# Patient Record
Sex: Male | Born: 1985
Health system: Southern US, Community
[De-identification: ages and names within clinical notes are randomized; demographics above are authoritative.]

## PROBLEM LIST (undated history)

## (undated) DIAGNOSIS — F329 Major depressive disorder, single episode, unspecified: Secondary | ICD-10-CM

## (undated) DIAGNOSIS — F32A Depression, unspecified: Secondary | ICD-10-CM

## (undated) DIAGNOSIS — Z87891 Personal history of nicotine dependence: Secondary | ICD-10-CM

## (undated) DIAGNOSIS — E119 Type 2 diabetes mellitus without complications: Secondary | ICD-10-CM

## (undated) DIAGNOSIS — K219 Gastro-esophageal reflux disease without esophagitis: Secondary | ICD-10-CM

## (undated) DIAGNOSIS — E669 Obesity, unspecified: Secondary | ICD-10-CM

## (undated) DIAGNOSIS — G4730 Sleep apnea, unspecified: Secondary | ICD-10-CM

## (undated) DIAGNOSIS — I10 Essential (primary) hypertension: Secondary | ICD-10-CM

## (undated) HISTORY — DX: Personal history of nicotine dependence: Z87.891

## (undated) HISTORY — PX: TONSILLECTOMY: SUR1361

## (undated) HISTORY — DX: Sleep apnea, unspecified: G47.30

## (undated) HISTORY — DX: Major depressive disorder, single episode, unspecified: F32.9

## (undated) HISTORY — DX: Essential (primary) hypertension: I10

## (undated) HISTORY — DX: Type 2 diabetes mellitus without complications: E11.9

## (undated) HISTORY — DX: Gastro-esophageal reflux disease without esophagitis: K21.9

## (undated) HISTORY — DX: Depression, unspecified: F32.A

## (undated) HISTORY — DX: Obesity, unspecified: E66.9

## (undated) HISTORY — PX: TONSILLECTOMY: SHX5217

---
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
## 2007-02-10 ENCOUNTER — Emergency Department: Payer: Self-pay | Admitting: Emergency Medicine

## 2011-02-06 ENCOUNTER — Emergency Department: Payer: Self-pay | Admitting: Unknown Physician Specialty

## 2012-01-22 ENCOUNTER — Emergency Department: Payer: Self-pay | Admitting: Emergency Medicine

## 2014-12-16 ENCOUNTER — Encounter: Payer: Self-pay | Admitting: Family Medicine

## 2014-12-16 ENCOUNTER — Ambulatory Visit (INDEPENDENT_AMBULATORY_CARE_PROVIDER_SITE_OTHER): Payer: Managed Care, Other (non HMO) | Admitting: Family Medicine

## 2014-12-16 VITALS — BP 145/91 | HR 96 | Temp 97.9°F | Ht 73.0 in

## 2014-12-16 DIAGNOSIS — H579 Unspecified disorder of eye and adnexa: Secondary | ICD-10-CM

## 2014-12-16 DIAGNOSIS — K219 Gastro-esophageal reflux disease without esophagitis: Secondary | ICD-10-CM

## 2014-12-16 DIAGNOSIS — I10 Essential (primary) hypertension: Secondary | ICD-10-CM | POA: Diagnosis not present

## 2014-12-16 DIAGNOSIS — E119 Type 2 diabetes mellitus without complications: Secondary | ICD-10-CM | POA: Diagnosis not present

## 2014-12-16 DIAGNOSIS — G4730 Sleep apnea, unspecified: Secondary | ICD-10-CM

## 2014-12-16 MED ORDER — LISINOPRIL-HYDROCHLOROTHIAZIDE 10-12.5 MG PO TABS: 1.0000 | ORAL_TABLET | Freq: Every day | ORAL | Status: DC

## 2014-12-16 NOTE — Patient Instructions (Addendum)
We'll check labs and refer you to the diabetic educator and eye doctor Start the new medicine for your blood pressure Return in 2-3 weeks for recheck and we'll get one lab then to recheck your kidneys Try to work on weight loss and healthy eating Check your feet every night, looking for infection or red streaks or sores; notify doctor right away of any problems  DASH Eating Plan DASH stands for "Dietary Approaches to Stop Hypertension." The DASH eating plan is a healthy eating plan that has been shown to reduce high blood pressure (hypertension). Additional health benefits may include reducing the risk of type 2 diabetes mellitus, heart disease, and stroke. The DASH eating plan may also help with weight loss. WHAT DO I NEED TO KNOW ABOUT THE DASH EATING PLAN? For the DASH eating plan, you will follow these general guidelines:  Choose foods with a percent daily value for sodium of less than 5% (as listed on the food label).  Use salt-free seasonings or herbs instead of table salt or sea salt.  Check with your health care provider or pharmacist before using salt substitutes.  Eat lower-sodium products, often labeled as "lower sodium" or "no salt added."  Eat fresh foods.  Eat more vegetables, fruits, and low-fat dairy products.  Choose whole grains. Look for the word "whole" as the first word in the ingredient list.  Choose fish and skinless chicken or [place] more often than red meat. Limit fish, poultry, and meat to 6 oz (170 g) each day.  Limit sweets, desserts, sugars, and sugary drinks.  Choose heart-healthy fats.  Limit cheese to 1 oz (28 g) per day.  Eat more home-cooked food and less restaurant, buffet, and fast food.  Limit fried foods.  Cook foods using methods other than frying.  Limit canned vegetables. If you do use them, rinse them well to decrease the sodium.  When eating at a restaurant, ask that your food be prepared with less salt, or no salt if possible. WHAT  FOODS CAN I EAT? Seek help from a dietitian for individual calorie needs. Grains Whole grain or whole wheat bread. Brown rice. Whole grain or whole wheat pasta. Quinoa, bulgur, and whole grain cereals. Low-sodium cereals. Corn or whole wheat flour tortillas. Whole grain cornbread. Whole grain crackers. Low-sodium crackers. Vegetables Fresh or frozen vegetables (raw, steamed, roasted, or grilled). Low-sodium or reduced-sodium tomato and vegetable juices. Low-sodium or reduced-sodium tomato sauce and paste. Low-sodium or reduced-sodium canned vegetables.  Fruits All fresh, canned (in natural juice), or frozen fruits. Meat and Other Protein Products Ground beef (85% or leaner), grass-fed beef, or beef trimmed of fat. Skinless chicken or [place]. Ground chicken or [place]. Pork trimmed of fat. All fish and seafood. Eggs. Dried beans, peas, or lentils. Unsalted nuts and seeds. Unsalted canned beans. Dairy Low-fat dairy products, such as skim or 1% milk, 2% or reduced-fat cheeses, low-fat ricotta or cottage cheese, or plain low-fat yogurt. Low-sodium or reduced-sodium cheeses. Fats and Oils Tub margarines without trans fats. Light or reduced-fat mayonnaise and salad dressings (reduced sodium). Avocado. Safflower, olive, or canola oils. Natural peanut or almond butter. Other Unsalted popcorn and pretzels. The items listed above may not be a complete list of recommended foods or beverages. Contact your dietitian for more options. WHAT FOODS ARE NOT RECOMMENDED? Grains White bread. White pasta. White rice. Refined cornbread. Bagels and croissants. Crackers that contain trans fat. Vegetables Creamed or fried vegetables. Vegetables in a cheese sauce. Regular canned vegetables. Regular canned tomato sauce  and paste. Regular tomato and vegetable juices. Fruits Dried fruits. Canned fruit in light or heavy syrup. Fruit juice. Meat and Other Protein Products Fatty cuts of meat. Ribs, chicken wings, bacon,  sausage, bologna, salami, chitterlings, fatback, hot dogs, bratwurst, and packaged luncheon meats. Salted nuts and seeds. Canned beans with salt. Dairy Whole or 2% milk, cream, half-and-half, and cream cheese. Whole-fat or sweetened yogurt. Full-fat cheeses or blue cheese. Nondairy creamers and whipped toppings. Processed cheese, cheese spreads, or cheese curds. Condiments Onion and garlic salt, seasoned salt, table salt, and sea salt. Canned and packaged gravies. Worcestershire sauce. Tartar sauce. Barbecue sauce. Teriyaki sauce. Soy sauce, including reduced sodium. Steak sauce. Fish sauce. Oyster sauce. Cocktail sauce. Horseradish. Ketchup and mustard. Meat flavorings and tenderizers. Bouillon cubes. Hot sauce. Tabasco sauce. Marinades. Taco seasonings. Relishes. Fats and Oils Butter, stick margarine, lard, shortening, ghee, and bacon fat. Coconut, palm kernel, or palm oils. Regular salad dressings. Other Pickles and olives. Salted popcorn and pretzels. The items listed above may not be a complete list of foods and beverages to avoid. Contact your dietitian for more information. WHERE CAN I FIND MORE INFORMATION? National Heart, Lung, and Blood Institute: [employer] Document Released: 02/23/2011 Document Revised: 07/21/2013 Document Reviewed: 01/08/2013 [facility] Patient Information 2015 [place], [place]. This information is not intended to replace advice given to you by your health care provider. Make sure you discuss any questions you have with your health care provider. Diabetes Mellitus and Food It is important for you to manage your blood sugar (glucose) level. Your blood glucose level can be greatly affected by what you eat. Eating healthier foods in the appropriate amounts throughout the day at about the same time each day will help you control your blood glucose level. It can also help slow or prevent worsening of your diabetes mellitus. Healthy eating  may even help you improve the level of your blood pressure and reach or maintain a healthy weight.  HOW CAN FOOD AFFECT ME? Carbohydrates Carbohydrates affect your blood glucose level more than any other type of food. Your dietitian will help you determine how many carbohydrates to eat at each meal and teach you how to count carbohydrates. Counting carbohydrates is important to keep your blood glucose at a healthy level, especially if you are using insulin or taking certain medicines for diabetes mellitus. Alcohol Alcohol can cause sudden decreases in blood glucose (hypoglycemia), especially if you use insulin or take certain medicines for diabetes mellitus. Hypoglycemia can be a life-threatening condition. Symptoms of hypoglycemia (sleepiness, dizziness, and disorientation) are similar to symptoms of having too much alcohol.  If your health care provider has given you approval to drink alcohol, do so in moderation and use the following guidelines:  Women should not have more than one drink per day, and men should not have more than two drinks per day. One drink is equal to:  12 oz of beer.  5 oz of wine.  1 oz of hard liquor.  Do not drink on an empty stomach.  Keep yourself hydrated. Have water, diet soda, or unsweetened iced tea.  Regular soda, juice, and other mixers might contain a lot of carbohydrates and should be counted. WHAT FOODS ARE NOT RECOMMENDED? As you make food choices, it is important to remember that all foods are not the same. Some foods have fewer nutrients per serving than other foods, even though they might have the same number of calories or carbohydrates. It is difficult to get your body what it needs  when you eat foods with fewer nutrients. Examples of foods that you should avoid that are high in calories and carbohydrates but low in nutrients include:  Trans fats (most processed foods list trans fats on the Nutrition Facts label).  Regular  soda.  Juice.  Candy.  Sweets, such as cake, pie, doughnuts, and cookies.  Fried foods. WHAT FOODS CAN I EAT? Have nutrient-rich foods, which will nourish your body and keep you healthy. The food you should eat also will depend on several factors, including:  The calories you need.  The medicines you take.  Your weight.  Your blood glucose level.  Your blood pressure level.  Your cholesterol level. You also should eat a variety of foods, including:  Protein, such as meat, poultry, fish, tofu, nuts, and seeds (lean animal proteins are best).  Fruits.  Vegetables.  Dairy products, such as milk, cheese, and yogurt (low fat is best).  Breads, grains, pasta, cereal, rice, and beans.  Fats such as olive oil, trans fat-free margarine, canola oil, avocado, and olives. DOES EVERYONE WITH DIABETES MELLITUS HAVE THE SAME MEAL PLAN? Because every person with diabetes mellitus is different, there is not one meal plan that works for everyone. It is very important that you meet with a dietitian who will help you create a meal plan that is just right for you. Document Released: 12/01/2004 Document Revised: 03/11/2013 Document Reviewed: 01/31/2013 [facility] Patient Information 2015 [place], [place]. This information is not intended to replace advice given to you by your health care provider. Make sure you discuss any questions you have with your health care provider.

## 2014-12-16 NOTE — Assessment & Plan Note (Signed)
Using APAP, under the care of another specialist

## 2014-12-16 NOTE — Assessment & Plan Note (Addendum)
Greater than 450 pounds visually, patient says he weighs 538 pounds but I just cannot verify that on our scales; will consider using injectable like Victoza to help with diabetes but also weight loss; he denies any hx of MEN, medullary thyroid carcinoma; check TSH today as well

## 2014-12-16 NOTE — Assessment & Plan Note (Signed)
Refer to eye doctor to evaluate the change seen after injury, but more for retinal evaluation, as it sounds like patient has not had diabetic eye exam for a few years

## 2014-12-16 NOTE — Progress Notes (Signed)
BP 145/91 mmHg  Pulse 96  Temp(Src) 97.9 F (36.6 C)  Ht  (1.854 m)  Wt   SpO2 98%   Subjective:    Patient ID: Walter Parks, male    DOB: Jan 25, 1986, 29 y.o.   MRN: [id number]  HPI: Walter Parks is a 29 y.o. male  Chief Complaint  Patient presents with  . Establish Care  Patient is brand new to the practice; has not had labs or medicines or medical care for quite some time; did not really elaborate, but is here at the encouragement of loved one  He has type 2 diabetes; diagnosed in 2014; used to take metformin, no problems taking that medicine, but has run out a while ago and is ready to get back on; diabetes runs in the family, aunts and cousins mostly and they are overweight; parents do not have diabetes  He has morbid obesity; this is his peak weight; he has always been heavy though; his weight has really increase though over the last several years; he started working third shift about 7 years ago at the [employer]; he has lost weight in the past using a specific diet plan, took Xenical; however, he had significant stool leakage and stopped taking it; worked well but the side effects were prohibitive; he eats the typical [employer], loves meat; not picky; no eggs or cheese; lactose intolerant  High blood pressure; chronic problem that has worsened since being off of his medicine; he used to take HCTZ; did pee too much and had dry mouth; uses some salt; not every meal and not every day  He has acid reflux; tomoatoes and oranges are triggers for his symptoms; he is allergic to lemons  Using APAP for sleep apnea; sees another doctor for that  Does feel frustration with health; but not SI, not really depression; that was listed from family input apparently  Flu shot declined by patient today (recommended, but he is not interested)  Past Medical History  Diagnosis Date  . Obesity   . Diabetes mellitus without complication   . Hypertension   . Sleep apnea     on an APAP  machine  . GERD (gastroesophageal reflux disease)   . Depression    Relevant past medical, surgical, family and social history reviewed and updated as indicated. Interim medical history since our last visit reviewed. Allergies and medications reviewed and updated.  Review of Systems  Constitutional: Negative for unexpected weight change.  HENT: Negative for ear discharge.   Eyes:       New spot on his right eye that came up a few years ago after an injury  Skin:       Had toenails removed a month ago; there were rubbing against his steel-toed boots  Allergic/Immunologic: Positive for food allergies.  Neurological: Negative for numbness.  Psychiatric/Behavioral: Negative for sleep disturbance (doing well with APAP) and dysphoric mood.   Per HPI unless specifically indicated above     Objective:    BP 145/91 mmHg  Pulse 96  Temp(Src) 97.9 F (36.6 C)  Ht  (1.854 m)  Wt   SpO2 98%  Wt Readings from Last 3 Encounters:  No data found for Wt  Patient was too large for our scales; patient says he weighs about 538 pounds That would put his BMI at about 71  Physical Exam  Constitutional: He appears well-developed. No distress.  Morbidly obese male, weight visually greater than 450 pounds  HENT:  Head: Normocephalic and atraumatic.  Neck:  Thick, obese  Cardiovascular: Normal rate and regular rhythm.  Exam reveals distant heart sounds.   Pulmonary/Chest: Effort normal. No respiratory distress. He has decreased breath sounds (breath sounds distant, most likely secondary to body habitus, morbid obesity).  Abdominal: Soft. Bowel sounds are normal.  Morbidly obese, large pannus  Musculoskeletal: He exhibits no edema.  Neurological: He displays no tremor.  Skin: Skin is warm and dry.  Psychiatric: He has a normal mood and affect. His speech is normal and behavior is normal. Judgment and thought content normal. Cognition and memory are normal.   Diabetic Foot Form - Detailed    Diabetic Foot Exam - detailed  Diabetic Foot exam was performed with the following findings:  Yes 12/16/2014  9:33 AM  Are the shoes appropriate in style and fit?:  Yes  Is there swelling or and abnormal foot shape?:  No  Are the toenails long?:  No  Are the toenails thick?:  No  Is there a claw toe deformity?:  No  Is there elevated skin temparature?:  No  Is there limited skin dorsiflexion?:  No  Are the toenails ingrown?:  No (Comment: status post matrixectomy both great toenails; fibrinous material bases of both nailbeds; no prox erythema)  Normal Range of Motion:  Yes    Pulse Foot Exam completed.:  Yes  Right Dorsalis Pedis:  Diminished Left Dorsalis Pedis:  Diminished  Sensory Foot Exam Completed.:  Yes  Semmes-Weinstein Monofilament Test  R Site 1-Great Toe:  Pos L Site 1-Great Toe:  Pos  R Site 4:  Pos L Site 4:  Pos  R Site 5:  Pos L Site 5:  Pos    Comments:  Thickened skin on the plantar surfaces, worse on lateral aspects      No results found for this or any previous visit.    Assessment & Plan:   Problem List Items Addressed This Visit      Cardiovascular and Mediastinum   Essential hypertension, benign    New problem to me (he is establishing care); uncontrolled today; start medicine; discussed starting lower dose of the HCTZ that he used to use, and adding a medicine to help protect his kidneys since he has diabetes; check creatinine and K+ today and recheck those again in 2-3 weeks; DASH guidelines encouragad      Relevant Medications   lisinopril-hydrochlorothiazide (PRINZIDE,ZESTORETIC) 10-12.5 MG tablet     Digestive   Acid reflux    Avoid triggers; likely exacerbated by his morbid obesity        Endocrine   Type 2 diabetes mellitus - Primary    Check A1C, urine microalbumin, check creatinine and then start metformin; consider starting Victoza or similar injectable; he denies any hx of MEN, med thyroid ca; refer to diabetic educator      Relevant  Medications   lisinopril-hydrochlorothiazide (PRINZIDE,ZESTORETIC) 10-12.5 MG tablet   Other Relevant Orders   Hgb A1c w/o eAG   Lipid Panel w/o Chol/HDL Ratio   Microalbumin / creatinine urine ratio   Comprehensive metabolic panel   Ambulatory referral to Ophthalmology   Ambulatory referral to diabetic education     Other   Eye lesion    Refer to eye doctor to evaluate the change seen after injury, but more for retinal evaluation, as it sounds like patient has not had diabetic eye exam for a few years      Relevant Orders   Ambulatory referral to Ophthalmology  Morbid obesity    Greater than 450 pounds visually, patient says he weighs 538 pounds but I just cannot verify that on our scales; will consider using injectable like Victoza to help with diabetes but also weight loss; he denies any hx of MEN, medullary thyroid carcinoma; check TSH today as well      Relevant Orders   TSH   Sleep apnea    Using APAP, under the care of another specialist         Follow up plan: Return 2-3 weeks, for blood pressure, diabetes.  An after-visit summary was printed and given to the patient at check-out.  Please see the patient instructions which may contain other information and recommendations beyond what is mentioned above in the assessment and plan.  Meds ordered this encounter  Medications  . lisinopril-hydrochlorothiazide (PRINZIDE,ZESTORETIC) 10-12.5 MG tablet    Sig: Take 1 tablet by mouth daily.    Dispense:  30 tablet    Refill:  0   Orders Placed This Encounter  Procedures  . Hgb A1c w/o eAG  . Lipid Panel w/o Chol/HDL Ratio  . TSH  . Microalbumin / creatinine urine ratio  . Comprehensive metabolic panel  . Ambulatory referral to Ophthalmology  . Ambulatory referral to diabetic education

## 2014-12-16 NOTE — Assessment & Plan Note (Signed)
New problem to me (he is establishing care); uncontrolled today; start medicine; discussed starting lower dose of the HCTZ that he used to use, and adding a medicine to help protect his kidneys since he has diabetes; check creatinine and K+ today and recheck those again in 2-3 weeks; DASH guidelines encouragad

## 2014-12-16 NOTE — Assessment & Plan Note (Signed)
Avoid triggers; likely exacerbated by his morbid obesity

## 2014-12-16 NOTE — Assessment & Plan Note (Signed)
Check A1C, urine microalbumin, check creatinine and then start metformin; consider starting Victoza or similar injectable; he denies any hx of MEN, med thyroid ca; refer to diabetic educator

## 2014-12-17 ENCOUNTER — Telehealth: Payer: Self-pay | Admitting: Family Medicine

## 2014-12-17 LAB — LIPID PANEL W/O CHOL/HDL RATIO
CHOLESTEROL TOTAL: 157 mg/dL (ref 100–199)
HDL: 31 mg/dL — AB (ref >39)
LDL Calculated: 109 mg/dL — ABNORMAL HIGH (ref 0–99)
TRIGLYCERIDES: 86 mg/dL (ref 0–149)
VLDL Cholesterol Cal: 17 mg/dL (ref 5–40)

## 2014-12-17 LAB — COMPREHENSIVE METABOLIC PANEL
ALBUMIN: 3.7 g/dL (ref 3.5–5.5)
ALT: 16 IU/L (ref 0–44)
AST: 19 IU/L (ref 0–40)
Albumin/Globulin Ratio: 1.1 (ref 1.1–2.5)
Alkaline Phosphatase: 109 IU/L (ref 39–117)
BUN / CREAT RATIO: 10 (ref 8–19)
BUN: 10 mg/dL (ref 6–20)
Bilirubin Total: 0.3 mg/dL (ref 0.0–1.2)
CALCIUM: 9.4 mg/dL (ref 8.7–10.2)
CHLORIDE: 99 mmol/L (ref 97–108)
CO2: 24 mmol/L (ref 18–29)
CREATININE: 1 mg/dL (ref 0.76–1.27)
GFR, EST AFRICAN AMERICAN: 118 mL/min/{1.73_m2} (ref >59)
GFR, EST NON AFRICAN AMERICAN: 102 mL/min/{1.73_m2} (ref >59)
GLUCOSE: 128 mg/dL — AB (ref 65–99)
Globulin, Total: 3.5 g/dL (ref 1.5–4.5)
Potassium: 4.3 mmol/L (ref 3.5–5.2)
Sodium: 140 mmol/L (ref 134–144)
TOTAL PROTEIN: 7.2 g/dL (ref 6.0–8.5)

## 2014-12-17 LAB — MICROALBUMIN / CREATININE URINE RATIO
Creatinine, Urine: 210.9 mg/dL
MICROALB/CREAT RATIO: 3.3 mg/g{creat} (ref 0.0–30.0)
Microalbumin, Urine: 7 ug/mL

## 2014-12-17 LAB — HGB A1C W/O EAG: Hgb A1c MFr Bld: 7.4 % — ABNORMAL HIGH (ref 4.8–5.6)

## 2014-12-17 LAB — TSH: TSH: 1.62 u[IU]/mL (ref 0.450–4.500)

## 2014-12-17 MED ORDER — METFORMIN HCL ER 500 MG PO TB24: ORAL_TABLET | ORAL | Status: DC

## 2014-12-17 NOTE — Telephone Encounter (Signed)
I called to go over labs with patient; left brief msg; will start him back on metformin, sending to pharmacy; will try him tomorrow to go over all labs

## 2014-12-21 NOTE — Telephone Encounter (Signed)
I talked with patient about labs He is urinating quite a bit; offered to stop the diuretic part; he'll give it a little more time; if palpitations or leg cramps, then do call before next appt in case he's urinating out too many electrolytes A1C not too bad for being off of medicine HDL too low, weight loss and more activity should help that Will continue current meds for now and see him back in a few weeks, but call before if needed; he agrees

## 2015-01-05 ENCOUNTER — Ambulatory Visit: Payer: Managed Care, Other (non HMO) | Admitting: Family Medicine

## 2015-02-04 ENCOUNTER — Other Ambulatory Visit: Payer: Self-pay | Admitting: Family Medicine

## 2015-02-04 NOTE — Telephone Encounter (Signed)
Routing to provider  

## 2015-02-05 ENCOUNTER — Telehealth: Payer: Self-pay | Admitting: Family Medicine

## 2015-02-05 MED ORDER — METFORMIN HCL ER 500 MG PO TB24: 1000.0000 mg | ORAL_TABLET | Freq: Every evening | ORAL | Status: DC

## 2015-02-05 NOTE — Telephone Encounter (Signed)
Patient no showed for his appointment He needs a visit and labs I'll refill this time

## 2015-02-05 NOTE — Telephone Encounter (Signed)
Called patient to call us back and reschedule his missed appointment with labs.

## 2015-02-10 ENCOUNTER — Encounter: Payer: Self-pay | Admitting: Family Medicine

## 2015-02-13 ENCOUNTER — Emergency Department
Admission: EM | Admit: 2015-02-13 | Discharge: 2015-02-13 | Disposition: A | Discharge status: Home / Self Care | Payer: Managed Care, Other (non HMO) | Attending: Emergency Medicine | Admitting: Emergency Medicine

## 2015-02-13 ENCOUNTER — Encounter: Payer: Self-pay | Admitting: Medical Oncology

## 2015-02-13 DIAGNOSIS — Z79899 Other long term (current) drug therapy: Secondary | ICD-10-CM | POA: Diagnosis not present

## 2015-02-13 DIAGNOSIS — M549 Dorsalgia, unspecified: Secondary | ICD-10-CM | POA: Diagnosis present

## 2015-02-13 DIAGNOSIS — I10 Essential (primary) hypertension: Secondary | ICD-10-CM | POA: Insufficient documentation

## 2015-02-13 DIAGNOSIS — Z7984 Long term (current) use of oral hypoglycemic drugs: Secondary | ICD-10-CM | POA: Insufficient documentation

## 2015-02-13 DIAGNOSIS — F17210 Nicotine dependence, cigarettes, uncomplicated: Secondary | ICD-10-CM | POA: Insufficient documentation

## 2015-02-13 DIAGNOSIS — M5416 Radiculopathy, lumbar region: Secondary | ICD-10-CM | POA: Diagnosis not present

## 2015-02-13 DIAGNOSIS — E119 Type 2 diabetes mellitus without complications: Secondary | ICD-10-CM | POA: Insufficient documentation

## 2015-02-13 DIAGNOSIS — M5410 Radiculopathy, site unspecified: Secondary | ICD-10-CM

## 2015-02-13 MED ORDER — METHOCARBAMOL 750 MG PO TABS: 1500.0000 mg | ORAL_TABLET | Freq: Four times a day (QID) | ORAL | Status: DC

## 2015-02-13 MED ORDER — ORPHENADRINE CITRATE 30 MG/ML IJ SOLN
60.0000 mg | Freq: Two times a day (BID) | INTRAMUSCULAR | Status: DC
  Administered 2015-02-13: 60 mg via INTRAMUSCULAR
  Filled 2015-02-13: qty 2

## 2015-02-13 MED ORDER — OXYCODONE-ACETAMINOPHEN 7.5-325 MG PO TABS: 1.0000 | ORAL_TABLET | Freq: Four times a day (QID) | ORAL | Status: DC | PRN

## 2015-02-13 MED ORDER — KETOROLAC TROMETHAMINE 60 MG/2ML IM SOLN
60.0000 mg | Freq: Once | INTRAMUSCULAR | Status: AC
  Administered 2015-02-13: 60 mg via INTRAMUSCULAR
  Filled 2015-02-13: qty 2

## 2015-02-13 MED ORDER — KETOROLAC TROMETHAMINE 10 MG PO TABS: 10.0000 mg | ORAL_TABLET | Freq: Four times a day (QID) | ORAL | Status: DC | PRN

## 2015-02-13 MED ORDER — HYDROMORPHONE HCL 1 MG/ML IJ SOLN
1.0000 mg | Freq: Once | INTRAMUSCULAR | Status: AC
  Administered 2015-02-13: 1 mg via INTRAMUSCULAR
  Filled 2015-02-13: qty 1

## 2015-02-13 NOTE — ED Notes (Signed)
Pt ambulatory to triage with reports of left lower back pain that radiates into left hip, began this am.

## 2015-02-13 NOTE — ED Provider Notes (Signed)
[facility] Emergency Department Provider Note  ____________________________________________  Time seen: Approximately 4:00 PM  I have reviewed the triage vital signs and the nursing notes.   HISTORY  Chief Complaint Back Pain    HPI Walter Parks is a 29 y.o. male patient complaining of acute onset of radicular left back pain. Patient states she awakened this morning with this complaint. This is the first episode for this patient.Patient denies any bladder or bowel dysfunction. No palliative measures taken for this complaint. Patient rated his pain as a 9/10 and described the pain as sharp.   Past Medical History  Diagnosis Date  . Obesity   . Diabetes mellitus without complication (HCC)   . Hypertension   . Sleep apnea     on an APAP machine  . GERD (gastroesophageal reflux disease)   . Depression     Patient Active Problem List   Diagnosis Date Noted  . Type 2 diabetes mellitus (HCC) 12/16/2014  . Eye lesion 12/16/2014  . Essential hypertension, benign 12/16/2014  . Morbid obesity (HCC) 12/16/2014  . Sleep apnea 12/16/2014  . Acid reflux 12/16/2014    Past Surgical History  Procedure Laterality Date  . Tonsillectomy      Current Outpatient Rx  Name  Route  Sig  Dispense  Refill  . ketorolac (TORADOL) 10 MG tablet   Oral   Take 1 tablet (10 mg total) by mouth every 6 (six) hours as needed.   20 tablet   0   . lisinopril-hydrochlorothiazide (PRINZIDE,ZESTORETIC) 10-12.5 MG tablet      TAKE 1 TABLET BY MOUTH DAILY.   30 tablet   0   . metFORMIN (GLUCOPHAGE-XR) 500 MG 24 hr tablet   Oral   Take 2 tablets (1,000 mg total) by mouth every evening. One by mouth every evening for one week, then two every evening   60 tablet   0     *Use THIS prescription please*   . methocarbamol (ROBAXIN-750) 750 MG tablet   Oral   Take 2 tablets (1,500 mg total) by mouth 4 (four) times daily.   40 tablet   0   . oxyCODONE-acetaminophen  (PERCOCET) 7.5-325 MG tablet   Oral   Take 1 tablet by mouth every 6 (six) hours as needed for severe pain.   12 tablet   0     Allergies Review of patient's allergies indicates no known allergies.  Family History  Problem Relation Age of Onset  . Hypertension Mother   . Hypertension Father   . Diabetes Maternal Aunt   . Hypertension Maternal Grandmother   . Hypertension Maternal Grandfather   . Stroke Paternal Grandmother   . Hypertension Paternal Grandmother   . Hypertension Paternal Grandfather   . Heart disease Neg Hx   . COPD Neg Hx   . Diabetes Cousin     Social History Social History  Substance Use Topics  . Smoking status: Current Every Day Smoker -- 0.50 packs/day for 18 years    Types: Cigarettes  . Smokeless tobacco: Never Used  . Alcohol Use: Yes     Comment: socially    Review of Systems Constitutional: No fever/chills. Morbid obesity  Eyes: No visual changes. ENT: No sore throat. Cardiovascular: Denies chest pain. Respiratory: Denies shortness of breath. Gastrointestinal: No abdominal pain.  No nausea, no vomiting.  No diarrhea.  No constipation. Genitourinary: Negative for dysuria. Musculoskeletal: Positive for back pain. Skin: Negative for rash. Neurological: Negative for headaches, focal weakness  or numbness. 10-point ROS otherwise negative.  ____________________________________________   PHYSICAL EXAM:  VITAL SIGNS: ED Triage Vitals  Enc Vitals Group     BP 02/13/15 1444 144/116 mmHg     Pulse Rate 02/13/15 1444 90     Resp 02/13/15 1444 21     Temp 02/13/15 1444 98.6 F (37 C)     Temp Source 02/13/15 1444 Oral     SpO2 02/13/15 1444 97 %     Weight 02/13/15 1444 550 lb (249.478 kg)     Height 02/13/15 1444 6\' 3"  (1.905 m)     Head Cir --      Peak Flow --      Pain Score 02/13/15 1445 9     Pain Loc --      Pain Edu? --      Excl. in GC? --     Constitutional: Alert and oriented. Moderate distress. Morbid obesity Eyes:  Conjunctivae are normal. PERRL. EOMI. Head: Atraumatic. Nose: No congestion/rhinnorhea. Mouth/Throat: Mucous membranes are moist.  Oropharynx non-erythematous. Neck: No stridor.  No cervical spine tenderness to palpation. Hematological/Lymphatic/Immunilogical: No cervical lymphadenopathy. Cardiovascular: Normal rate, regular rhythm. Grossly normal heart sounds.  Good peripheral circulation. Respiratory: Normal respiratory effort.  No retractions. Lungs CTAB. Gastrointestinal: Soft and nontender. No distention. No abdominal bruits. No CVA tenderness. Musculoskeletal: Examination limited by habitus. No obvious deformity. No guarding palpation spinal processes. Patient has a positive straight leg raise on the left side 70. Neurologic:  Normal speech and language. No gross focal neurologic deficits are appreciated. No gait instability. Skin:  Skin is warm, dry and intact. No rash noted. Psychiatric: Mood and affect are normal. Speech and behavior are normal.  ____________________________________________   LABS (all labs ordered are listed, but only abnormal results are displayed)  Labs Reviewed - No data to display ____________________________________________  EKG   ____________________________________________  RADIOLOGY   ____________________________________________   PROCEDURES  Procedure(s) performed: None  Critical Care performed: No  ____________________________________________   INITIAL IMPRESSION / ASSESSMENT AND PLAN / ED COURSE  Pertinent labs & imaging results that were available during my care of the patient were reviewed by me and considered in my medical decision making (see chart for details).  Left radicular back pain. Patient given home discharge care instructions. Patient given a prescription for Percocet, Robaxin, and Toradol. Patient advised to follow-up with family doctor for continued care. ____________________________________________   FINAL  CLINICAL IMPRESSION(S) / ED DIAGNOSES  Final diagnoses:  Radicular low back pain      Joni ReiningRonald K Nikki Rusnak, PA-C 02/13/15 1610  Minna AntisKevin Paduchowski, MD 02/13/15 2254

## 2015-02-13 NOTE — Discharge Instructions (Signed)
Radicular Pain °Radicular pain in either the arm or leg is usually from a bulging or herniated disk in the spine. A piece of the herniated disk may press against the nerves as the nerves exit the spine. This causes pain which is felt at the tips of the nerves down the arm or leg. Other causes of radicular pain may include: °· Fractures. °· Heart disease. °· Cancer. °· An abnormal and usually degenerative state of the nervous system or nerves (neuropathy). °Diagnosis may require CT or MRI scanning to determine the primary cause.  °Nerves that start at the neck (nerve roots) may cause radicular pain in the outer shoulder and arm. It can spread down to the thumb and fingers. The symptoms vary depending on which nerve root has been affected. In most cases radicular pain improves with conservative treatment. Neck problems may require physical therapy, a neck collar, or cervical traction. Treatment may take many weeks, and surgery may be considered if the symptoms do not improve.  °Conservative treatment is also recommended for sciatica. Sciatica causes pain to radiate from the lower back or buttock area down the leg into the foot. Often there is a history of back problems. Most patients with sciatica are better after 2 to 4 weeks of rest and other supportive care. Short term bed rest can reduce the disk pressure considerably. Sitting, however, is not a good position since this increases the pressure on the disk. You should avoid bending, lifting, and all other activities which make the problem worse. Traction can be used in severe cases. Surgery is usually reserved for patients who do not improve within the first months of treatment. °Only take over-the-counter or prescription medicines for pain, discomfort, or fever as directed by your caregiver. Narcotics and muscle relaxants may help by relieving more severe pain and spasm and by providing mild sedation. Cold or massage can give significant relief. Spinal manipulation  is not recommended. It can increase the degree of disc protrusion. Epidural steroid injections are often effective treatment for radicular pain. These injections deliver medicine to the spinal nerve in the space between the protective covering of the spinal cord and back bones (vertebrae). Your caregiver can give you more information about steroid injections. These injections are most effective when given within two weeks of the onset of pain.  °You should see your caregiver for follow up care as recommended. A program for neck and back injury rehabilitation with stretching and strengthening exercises is an important part of management.  °SEEK IMMEDIATE MEDICAL CARE IF: °· You develop increased pain, weakness, or numbness in your arm or leg. °· You develop difficulty with bladder or bowel control. °· You develop abdominal pain. °  °This information is not intended to replace advice given to you by your health care provider. Make sure you discuss any questions you have with your health care provider. °  °Document Released: 04/13/2004 Document Revised: 03/27/2014 Document Reviewed: 09/30/2014 °Elsevier Interactive Patient Education ©2016 [employer] ° °

## 2015-02-27 ENCOUNTER — Encounter: Payer: Self-pay | Admitting: Family Medicine

## 2015-02-27 NOTE — Telephone Encounter (Signed)
Called 3x and lefet vm but therefore I'm sending letter out 02/27/15.

## 2015-04-08 ENCOUNTER — Other Ambulatory Visit: Payer: Self-pay | Admitting: Family Medicine

## 2015-04-08 NOTE — Telephone Encounter (Signed)
Patient was supposed to be seen October 18th but he did not keep that appointment He really needs to be seen Please ask him to come in to see me for high blood pressure and diabetes I'll send in ten more pills but we really would like to take care of him here

## 2015-04-09 ENCOUNTER — Encounter: Payer: Self-pay | Admitting: Family Medicine

## 2015-04-09 NOTE — Telephone Encounter (Signed)
I tried to call patient but the number we have is disconnected therefore I will send a letter out today.

## 2015-04-19 ENCOUNTER — Encounter: Payer: Self-pay | Admitting: Family Medicine

## 2015-04-19 ENCOUNTER — Ambulatory Visit (INDEPENDENT_AMBULATORY_CARE_PROVIDER_SITE_OTHER): Payer: Managed Care, Other (non HMO) | Admitting: Family Medicine

## 2015-04-19 VITALS — BP 168/110 | HR 87 | Temp 98.2°F | Wt >= 6400 oz

## 2015-04-19 DIAGNOSIS — E119 Type 2 diabetes mellitus without complications: Secondary | ICD-10-CM

## 2015-04-19 DIAGNOSIS — I10 Essential (primary) hypertension: Secondary | ICD-10-CM

## 2015-04-19 DIAGNOSIS — Z5181 Encounter for therapeutic drug level monitoring: Secondary | ICD-10-CM | POA: Insufficient documentation

## 2015-04-19 MED ORDER — LISINOPRIL 10 MG PO TABS: 10.0000 mg | ORAL_TABLET | Freq: Every day | ORAL | Status: DC

## 2015-04-19 MED ORDER — AMLODIPINE BESYLATE 10 MG PO TABS: 10.0000 mg | ORAL_TABLET | Freq: Every day | ORAL | Status: DC

## 2015-04-19 NOTE — Assessment & Plan Note (Signed)
Check A1c. 

## 2015-04-19 NOTE — Progress Notes (Signed)
BP 168/110 mmHg  Pulse 87  Temp(Src) 98.2 F (36.8 C)  Wt 558 lb (253.107 kg)  SpO2 99%   Subjective:    Patient ID: Walter Parks, male    DOB: 1986-02-01, 30 y.o.   MRN: [id number]  HPI: [name] is a 30 y.o. male  Chief Complaint  Patient presents with  . Diabetes    routine follow up and labs; patient will call and schedule eye exam  . Hypertension    routine follow up and labs  . Morbid Obesity    per patient, he has gained around 20 lbs since last visit   Diabetes; checking sugars; 184 was the last fasting sugar; dry mouth; no blurred vision; not checking feet; he is going to get eye exam scheduled  HTN; he is completely out of his medicines; he does not like the diuretic; apologized for missing last appt; not a big salt eater; checks labels; not much fast food  He has gained about 20 pounds; he has morbid obesity; he has done a lot of research on the bariatric surgery options; he is most interested in the lap band; he has been to the bariatric clinic maybe ten years ago; they put him on Xenical and that was too much; not a good option  Past Medical History  Diagnosis Date  . Obesity   . Diabetes mellitus without complication (HCC)   . Hypertension   . Sleep apnea     on an APAP machine  . GERD (gastroesophageal reflux disease)   . Depression     Past Surgical History  Procedure Laterality Date  . Tonsillectomy    varicose vein surgery both legs, sclerotherapy; no pain  Relevant past medical, surgical, family and social history reviewed and updated as indicated. Interim medical history since our last visit reviewed. Allergies and medications reviewed and updated.  Review of Systems Per HPI unless specifically indicated above     Objective:    BP 168/110 mmHg  Pulse 87  Temp(Src) 98.2 F (36.8 C)  Wt 558 lb (253.107 kg)  SpO2 99%  Wt Readings from Last 3 Encounters:  04/19/15 558 lb (253.107 kg)  02/13/15 550 lb (249.478 kg)   body mass index is  69.75 kg/(m^2).   Physical Exam  Constitutional: He appears well-developed and well-nourished.  Morbidly obese  Eyes: EOM are normal. No scleral icterus.  Neck: No JVD present.  Cardiovascular: Normal rate and regular rhythm.   Heart sounds are distant due to morbid obesity  Pulmonary/Chest: Effort normal. He has decreased breath sounds.  Decreased breath sounds throughout secondary to morbid obesity  Abdominal: Soft. Bowel sounds are normal. There is no tenderness.  Musculoskeletal: He exhibits no edema.       Right foot: There is deformity (pes planus).       Left foot: There is deformity (pes planus).  Neurological: He is alert.  Skin: Skin is warm. No pallor.  Psychiatric: He has a normal mood and affect.     Diabetic Foot Form - Detailed   Diabetic Foot Exam - detailed  Diabetic Foot exam was performed with the following findings:  Yes 04/19/2015  9:42 AM  Visual Foot Exam completed.:  Yes  Are the toenails long?:  No  Are the toenails thick?:  No  Are the toenails ingrown?:  No    Pulse Foot Exam completed.:  Yes  Right Dorsalis Pedis:  Diminished Left Dorsalis Pedis:  Diminished  Sensory Foot Exam Completed.:  Yes  Swelling:  Yes (Comment: morbid obesity)  Semmes-Weinstein Monofilament Test  R Site 1-Great Toe:  Pos L Site 1-Great Toe:  Pos  R Site 4:  Pos L Site 4:  Pos  R Site 5:  Pos L Site 5:  Pos    Comments:  Morbid obesity; pes planus; obesity limits palpation of pulses; feet are warm      Results for orders placed or performed in visit on 12/16/14  Hgb A1c w/o eAG  Result Value Ref Range   Hgb A1c MFr Bld 7.4 (H) 4.8 - 5.6 %  Lipid Panel w/o Chol/HDL Ratio  Result Value Ref Range   Cholesterol, Total 157 100 - 199 mg/dL   Triglycerides 86 0 - 149 mg/dL   HDL 31 (L) >16 mg/dL   VLDL Cholesterol Cal 17 5 - 40 mg/dL   LDL Calculated 109 (H) 0 - 99 mg/dL  TSH  Result Value Ref Range   TSH 1.620 0.450 - 4.500 uIU/mL  Microalbumin / creatinine urine  ratio  Result Value Ref Range   Creatinine, Urine 210.9 Not Estab. mg/dL   Microalbum.,U,Random 7.0 Not Estab. ug/mL   MICROALB/CREAT RATIO 3.3 0.0 - 30.0 mg/g creat  Comprehensive metabolic panel  Result Value Ref Range   Glucose 128 (H) 65 - 99 mg/dL   BUN 10 6 - 20 mg/dL   Creatinine, Ser 6.04 0.76 - 1.27 mg/dL   GFR calc non Af Amer 102 >59 mL/min/1.73   GFR calc Af Amer 118 >59 mL/min/1.73   BUN/Creatinine Ratio 10 8 - 19   Sodium 140 134 - 144 mmol/L   Potassium 4.3 3.5 - 5.2 mmol/L   Chloride 99 97 - 108 mmol/L   CO2 24 18 - 29 mmol/L   Calcium 9.4 8.7 - 10.2 mg/dL   Total Protein 7.2 6.0 - 8.5 g/dL   Albumin 3.7 3.5 - 5.5 g/dL   Globulin, Total 3.5 1.5 - 4.5 g/dL   Albumin/Globulin Ratio 1.1 1.1 - 2.5   Bilirubin Total 0.3 0.0 - 1.2 mg/dL   Alkaline Phosphatase 109 39 - 117 IU/L   AST 19 0 - 40 IU/L   ALT 16 0 - 44 IU/L      Assessment & Plan:   Problem List Items Addressed This Visit      Cardiovascular and Mediastinum   Essential hypertension, benign    Uncontrolled; he does not like the diuretic; will start amlodipine 10 mg daily plus lisinopril 10 mg daily; return for close f/u and recheck BMP in 2 weeks; discussed risk of stroke, heart failure, cardiomegaly; necessary for patient to lose significant weight (100+ pounds) so will refer to bariatric surgery; DASH guidelines encouraged      Relevant Medications   lisinopril (PRINIVIL,ZESTRIL) 10 MG tablet   amLODipine (NORVASC) 10 MG tablet     Endocrine   Type 2 diabetes mellitus (HCC) - Primary    Check A1c      Relevant Medications   lisinopril (PRINIVIL,ZESTRIL) 10 MG tablet   Other Relevant Orders   Lipid Panel w/o Chol/HDL Ratio   Hgb A1c w/o eAG     Other   Morbid obesity (HCC)    Refer to bariatric surgeon; encouraged weight loss with decreased calories and increased activity; limit empty calories      Relevant Orders   Ambulatory referral to General Surgery   TSH   Medication monitoring  encounter   Relevant Orders   Comprehensive metabolic panel  Follow up plan: Return in about 2 weeks (around 05/03/2015) for blood pressure.  An after-visit summary was printed and given to the patient at check-out.  Please see the patient instructions which may contain other information and recommendations beyond what is mentioned above in the assessment and plan.  Orders Placed This Encounter  Procedures  . Comprehensive metabolic panel  . Lipid Panel w/o Chol/HDL Ratio  . Hgb A1c w/o eAG  . TSH  . Ambulatory referral to General Surgery    Meds ordered this encounter  Medications  . lisinopril (PRINIVIL,ZESTRIL) 10 MG tablet    Sig: Take 1 tablet (10 mg total) by mouth daily.    Dispense:  90 tablet    Refill:  1  . amLODipine (NORVASC) 10 MG tablet    Sig: Take 1 tablet (10 mg total) by mouth daily.    Dispense:  90 tablet    Refill:  1   Medications Discontinued During This Encounter  Medication Reason  . ketorolac (TORADOL) 10 MG tablet Completed Course  . methocarbamol (ROBAXIN-750) 750 MG tablet Completed Course  . oxyCODONE-acetaminophen (PERCOCET) 7.5-325 MG tablet Completed Course  . lisinopril-hydrochlorothiazide (PRINZIDE,ZESTORETIC) 10-12.5 MG tablet Discontinued by provider

## 2015-04-19 NOTE — Patient Instructions (Addendum)
Check out the information at familydoctor.org entitled "What It Takes to Lose Weight" Try to lose between 1-2 pounds per week by taking in fewer calories and burning off more calories You can succeed by limiting portions, limiting foods dense in calories and fat, becoming more active, and drinking 8 glasses of water a day (64 ounces) Don't skip meals, especially breakfast, as skipping meals may alter your metabolism Do not use over-the-counter weight loss pills or gimmicks that claim rapid weight loss A healthy BMI (or body mass index) is between 18.5 and 24.9 You can calculate your ideal BMI at the NIH website JobE[employer]ics.hu  Your goal blood pressure is less than 130 mmHg on top. Try to follow the DASH guidelines (DASH stands for Dietary Approaches to Stop Hypertension) Try to limit the sodium in your diet.  Ideally, consume less than 1.5 grams (less than 1,500mg ) per day. Do not add salt when cooking or at the table.  Check the sodium amount on labels when shopping, and choose items lower in sodium when given a choice. Avoid or limit foods that already contain a lot of sodium. Eat a diet rich in fruits and vegetables and whole grains.  We'll get rid of the diuretic from your blood pressure medicine Start two blood pressure medicines: Lisinopril 10 mg daily Amlodipine 10 mg daily Return in 2 weeks for recheck of your blood pressure and to check your kidney function and potassium Stay away from salt substitutes and fruit smoothies on the lisinopril  We'll refer you to a bariatric surgeon  Please do schedule an appointment to see your eye doctor, and have your eyes examined every year Check your feet every night and let me know right away of any sores, infections, numbness, etc. Try to limit sweets, white bread, white rice, white potatoes It is okay for you to not check your fingerstick blood sugars (per [employer]  Best Practices)  DASH Eating Plan DASH stands for "Dietary Approaches to Stop Hypertension." The DASH eating plan is a healthy eating plan that has been shown to reduce high blood pressure (hypertension). Additional health benefits may include reducing the risk of type 2 diabetes mellitus, heart disease, and stroke. The DASH eating plan may also help with weight loss. WHAT DO I NEED TO KNOW ABOUT THE DASH EATING PLAN? For the DASH eating plan, you will follow these general guidelines:  Choose foods with a percent daily value for sodium of less than 5% (as listed on the food label).  Use salt-free seasonings or herbs instead of table salt or sea salt.  Check with your health care provider or pharmacist before using salt substitutes.  Eat lower-sodium products, often labeled as "lower sodium" or "no salt added."  Eat fresh foods.  Eat more vegetables, fruits, and low-fat dairy products.  Choose whole grains. Look for the word "whole" as the first word in the ingredient list.  Choose fish and skinless chicken or [place] more often than red meat. Limit fish, poultry, and meat to 6 oz (170 g) each day.  Limit sweets, desserts, sugars, and sugary drinks.  Choose heart-healthy fats.  Limit cheese to 1 oz (28 g) per day.  Eat more home-cooked food and less restaurant, buffet, and fast food.  Limit fried foods.  Cook foods using methods other than frying.  Limit canned vegetables. If you do use them, rinse them well to decrease the sodium.  When eating at a restaurant, ask that your food be prepared with less  salt, or no salt if possible. WHAT FOODS CAN I EAT? Seek help from a dietitian for individual calorie needs. Grains Whole grain or whole wheat bread. Brown rice. Whole grain or whole wheat pasta. Quinoa, bulgur, and whole grain cereals. Low-sodium cereals. Corn or whole wheat flour tortillas. Whole grain cornbread. Whole grain crackers. Low-sodium crackers. Vegetables Fresh or  frozen vegetables (raw, steamed, roasted, or grilled). Low-sodium or reduced-sodium tomato and vegetable juices. Low-sodium or reduced-sodium tomato sauce and paste. Low-sodium or reduced-sodium canned vegetables.  Fruits All fresh, canned (in natural juice), or frozen fruits. Meat and Other Protein Products Ground beef (85% or leaner), grass-fed beef, or beef trimmed of fat. Skinless chicken or [place]. Ground chicken or [place]. Pork trimmed of fat. All fish and seafood. Eggs. Dried beans, peas, or lentils. Unsalted nuts and seeds. Unsalted canned beans. Dairy Low-fat dairy products, such as skim or 1% milk, 2% or reduced-fat cheeses, low-fat ricotta or cottage cheese, or plain low-fat yogurt. Low-sodium or reduced-sodium cheeses. Fats and Oils Tub margarines without trans fats. Light or reduced-fat mayonnaise and salad dressings (reduced sodium). Avocado. Safflower, olive, or canola oils. Natural peanut or almond butter. Other Unsalted popcorn and pretzels. The items listed above may not be a complete list of recommended foods or beverages. Contact your dietitian for more options. WHAT FOODS ARE NOT RECOMMENDED? Grains White bread. White pasta. White rice. Refined cornbread. Bagels and croissants. Crackers that contain trans fat. Vegetables Creamed or fried vegetables. Vegetables in a cheese sauce. Regular canned vegetables. Regular canned tomato sauce and paste. Regular tomato and vegetable juices. Fruits Dried fruits. Canned fruit in light or heavy syrup. Fruit juice. Meat and Other Protein Products Fatty cuts of meat. Ribs, chicken wings, bacon, sausage, bologna, salami, chitterlings, fatback, hot dogs, bratwurst, and packaged luncheon meats. Salted nuts and seeds. Canned beans with salt. Dairy Whole or 2% milk, cream, half-and-half, and cream cheese. Whole-fat or sweetened yogurt. Full-fat cheeses or blue cheese. Nondairy creamers and whipped toppings. Processed cheese, cheese spreads, or  cheese curds. Condiments Onion and garlic salt, seasoned salt, table salt, and sea salt. Canned and packaged gravies. Worcestershire sauce. Tartar sauce. Barbecue sauce. Teriyaki sauce. Soy sauce, including reduced sodium. Steak sauce. Fish sauce. Oyster sauce. Cocktail sauce. Horseradish. Ketchup and mustard. Meat flavorings and tenderizers. Bouillon cubes. Hot sauce. Tabasco sauce. Marinades. Taco seasonings. Relishes. Fats and Oils Butter, stick margarine, lard, shortening, ghee, and bacon fat. Coconut, palm kernel, or palm oils. Regular salad dressings. Other Pickles and olives. Salted popcorn and pretzels. The items listed above may not be a complete list of foods and beverages to avoid. Contact your dietitian for more information. WHERE CAN I FIND MORE INFORMATION? National Heart, Lung, and Blood Institute: CablePromo.it   This information is not intended to replace advice given to you by your health care provider. Make sure you discuss any questions you have with your health care provider.   Document Released: 02/23/2011 Document Revised: 03/27/2014 Document Reviewed: 01/08/2013 Elsevier Interactive Patient Education 2016 Elsevier Inc. Bariatric Surgery Information Bariatric surgery, also called weight loss surgery, is a procedure that helps you lose weight. You may consider or your health care provider may suggest bariatric surgery if:  You are severely obese and have been unable to lose weight through diet and exercise.  You have health problems related to obesity, such as:  Type 2 diabetes.  Heart disease.  Lung disease. HOW DOES BARIATRIC SURGERY HELP ME LOSE WEIGHT?  Bariatric surgery helps you lose weight by decreasing  how much food your body absorbs. This is done by closing off part of your stomach to make it smaller. This restricts the amount of food your stomach can hold. Bariatric surgery can also change your body's regular digestive  process, so that food bypasses the parts of your body that absorb calories and nutrients.  If you decide to have bariatric surgery, it is important to continue to eat a healthy diet and exercise regularly after the surgery.  WHAT ARE THE DIFFERENT KINDS OF BARIATRIC SURGERY?  There are two kinds of bariatric surgeries:  Restrictive surgeries make your stomach smaller. They do not change your digestive process. The smaller the size of your new stomach, the less food you can eat. There are different types of restrictive surgeries.  Malabsorptive surgeries both make your stomach smaller and alter your digestive process so that your body processes less calories and nutrients. These are the most common kind of bariatric surgery. There are different types of malabsorptive surgeries. WHAT ARE THE DIFFERENT TYPES OF RESTRICTIVE SURGERY? Adjustable Gastric Banding In this procedure, an inflatable band is placed around your stomach near the upper end. This makes the passageway for food into the rest of your stomach much smaller. The band can be adjusted, making it tighter or looser, by filling it with salt solution. Your surgeon can adjust the band based on how are you feeling and how much weight you are losing. The band can be removed in the future.  Vertical Banded Gastroplasty In this procedure, staples are used to separate your stomach into two parts, a small upper pouch and a bigger lower pouch. This decreases how much food you can eat. Sleeve Gastrectomy In this procedure, your stomach is made smaller. This is done by surgically removing a large part of your stomach. When your stomach is smaller, you feel full more quickly and reduce how much you eat. WHAT ARE THE DIFFERENT TYPES OF MALABSORPTIVE SURGERY? Roux-en-Y Gastric Bypass (RGB) This is the most common weight loss surgery. In this procedure, a small stomach pouch is created in the upper part of your stomach. Next, this small stomach pouch is  attached directly to the middle part of your small intestine. The farther down your small intestine the new connection is made, the fewer calories and nutrients you will absorb.  Biliopancreatic Diversion with Duodenal Switch (BPD/DS)  This is a multi-step procedure. In this procedure, a large part of your stomach is removed, making your stomach smaller. Next, this smaller stomach is attached to the lower part of your small intestine. Like the RGB surgery, you absorb fewer calories and nutrients the farther down your small intestine the attachment is made.   WHAT ARE THE RISKS OF BARIATRIC SURGERY? As with any surgical procedure, each type of bariatric surgery has its own risks. These risks also depend on your age, your overall health, and any other medical conditions you may have. When deciding on bariatric surgery, it is very important to:   Talk to your health care provider and choose the surgery that is best for you.  Ask your health care provider about specific risks for the surgery you choose. FOR MORE INFORMATION  American Society for Metabolic & Bariatric Surgery: www.asmbs.org  Weight-control Information Network (WIN): win.StageSync.si   This information is not intended to replace advice given to you by your health care provider. Make sure you discuss any questions you have with your health care provider.   Document Released: 03/06/2005 Document Revised: 11/25/2014 Document Reviewed: 09/04/2012  Chartered certified accountant Patient Education [employer].

## 2015-04-19 NOTE — Assessment & Plan Note (Signed)
Uncontrolled; he does not like the diuretic; will start amlodipine 10 mg daily plus lisinopril 10 mg daily; return for close f/u and recheck BMP in 2 weeks; discussed risk of stroke, heart failure, cardiomegaly; necessary for patient to lose significant weight (100+ pounds) so will refer to bariatric surgery; DASH guidelines encouraged

## 2015-04-19 NOTE — Assessment & Plan Note (Signed)
Refer to bariatric surgeon; encouraged weight loss with decreased calories and increased activity; limit empty calories

## 2015-04-20 ENCOUNTER — Encounter: Payer: Self-pay | Admitting: Family Medicine

## 2015-04-20 LAB — COMPREHENSIVE METABOLIC PANEL
ALK PHOS: 117 IU/L (ref 39–117)
ALT: 19 IU/L (ref 0–44)
AST: 24 IU/L (ref 0–40)
Albumin/Globulin Ratio: 1.2 (ref 1.1–2.5)
Albumin: 4.2 g/dL (ref 3.5–5.5)
BUN/Creatinine Ratio: 8 (ref 8–19)
BUN: 8 mg/dL (ref 6–20)
Bilirubin Total: 0.4 mg/dL (ref 0.0–1.2)
CALCIUM: 9.6 mg/dL (ref 8.7–10.2)
CO2: 25 mmol/L (ref 18–29)
CREATININE: 0.97 mg/dL (ref 0.76–1.27)
Chloride: 97 mmol/L (ref 96–106)
GFR calc Af Amer: 122 mL/min/{1.73_m2} (ref >59)
GFR, EST NON AFRICAN AMERICAN: 106 mL/min/{1.73_m2} (ref >59)
GLOBULIN, TOTAL: 3.6 g/dL (ref 1.5–4.5)
GLUCOSE: 123 mg/dL — AB (ref 65–99)
Potassium: 4.3 mmol/L (ref 3.5–5.2)
SODIUM: 140 mmol/L (ref 134–144)
Total Protein: 7.8 g/dL (ref 6.0–8.5)

## 2015-04-20 LAB — LIPID PANEL W/O CHOL/HDL RATIO
CHOLESTEROL TOTAL: 171 mg/dL (ref 100–199)
HDL: 34 mg/dL — AB (ref >39)
LDL CALC: 114 mg/dL — AB (ref 0–99)
TRIGLYCERIDES: 115 mg/dL (ref 0–149)
VLDL CHOLESTEROL CAL: 23 mg/dL (ref 5–40)

## 2015-04-20 LAB — TSH: TSH: 1.94 u[IU]/mL (ref 0.450–4.500)

## 2015-04-20 LAB — HGB A1C W/O EAG: HEMOGLOBIN A1C: 7.1 % — AB (ref 4.8–5.6)

## 2015-05-03 ENCOUNTER — Ambulatory Visit (INDEPENDENT_AMBULATORY_CARE_PROVIDER_SITE_OTHER): Payer: Managed Care, Other (non HMO) | Admitting: Family Medicine

## 2015-05-03 ENCOUNTER — Encounter: Payer: Self-pay | Admitting: Family Medicine

## 2015-05-03 VITALS — BP 146/71 | HR 105 | Temp 98.6°F | Ht 74.0 in | Wt >= 6400 oz

## 2015-05-03 DIAGNOSIS — E119 Type 2 diabetes mellitus without complications: Secondary | ICD-10-CM | POA: Diagnosis not present

## 2015-05-03 DIAGNOSIS — I10 Essential (primary) hypertension: Secondary | ICD-10-CM | POA: Diagnosis not present

## 2015-05-03 NOTE — Patient Instructions (Signed)
Your goal blood pressure is less than 130 mmHg on top, less than 80 on the bottom Try to follow the DASH guidelines (DASH stands for Dietary Approaches to Stop Hypertension) Try to limit the sodium in your diet.  Ideally, consume less than 1.5 grams (less than 1,500mg ) per day. Do not add salt when cooking or at the table.  Check the sodium amount on labels when shopping, and choose items lower in sodium when given a choice. Avoid or limit foods that already contain a lot of sodium. Eat a diet rich in fruits and vegetables and whole grains. We'll contact you about the bariatric surgery referral If you have not heard anything from my staff in a week about any orders/referrals/studies from today, please contact us here to follow-up [phone]

## 2015-05-03 NOTE — Progress Notes (Signed)
BP 146/71 mmHg  Pulse 105  Temp(Src) 98.6 F (37 C)  Ht  (1.88 m)  Wt 550 lb (249.478 kg)  BMI 70.59 kg/m2  SpO2 97%   Subjective:    Patient ID: Walter Parks, male    DOB: October 19, 1985, 30 y.o.   MRN: [id number]  HPI: Walter Parks is a 30 y.o. male  Chief Complaint  Patient presents with  . Diabetes    Haven't really checked blood sugar  . Hypertension  . 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 Interim medical history since our last visit reviewed. Allergies and medications reviewed and updated.  Review of Systems Per HPI unless specifically indicated above     Objective:    BP 146/71 mmHg  Pulse 105  Temp(Src) 98.6 F (37 C)  Ht  (1.88 m)  Wt 550 lb (249.478 kg)  BMI 70.59 kg/m2  SpO2 97%  Wt Readings from Last 3 Encounters:  05/03/15 550 lb (249.478 kg)  04/19/15 558 lb (253.107 kg)  02/13/15 550 lb (249.478 kg)  **it turns out that we did not weigh him today or ever; these weights are estimates; our scale does not accomodate his size** -Dr. Sherie Don  Physical Exam  Constitutional: He appears well-developed and well-nourished.  Morbidly obese  Eyes: EOM are normal. No scleral icterus.  Neck: No JVD present.  Cardiovascular: Regular rhythm.   Heart sounds are distant due to morbid obesity  Pulmonary/Chest: Effort normal. He has decreased breath sounds.  Decreased breath sounds throughout secondary to  morbid obesity  Abdominal: Soft. Bowel sounds are normal. There is no tenderness.  Musculoskeletal: He exhibits no edema.       Right foot: There is deformity (pes planus).       Left foot: There is deformity (pes planus).  Neurological: He is alert.  Skin: Skin is warm. No pallor.  Psychiatric: He has a normal mood and affect.   Diabetic Foot Form - Detailed   Diabetic Foot Exam - detailed  Diabetic Foot exam was performed with the following findings:  Yes 05/03/2015  2:17 PM  Visual Foot Exam completed.:  Yes  Is there swelling or and abnormal foot shape?:  Yes    Pulse Foot Exam completed.:  Yes  Right Dorsalis Pedis:  Present Left Dorsalis Pedis:  Present  Sensory Foot Exam Completed.:  Yes  Semmes-Weinstein Monofilament Test  R Site 1-Great Toe:  Pos L Site 1-Great Toe:  Pos  R Site 4:  Pos L Site 4:  Pos  R Site 5:  Pos L Site 5:  Pos    Comments:  Morbidly obese; flat-footed, skin on the soles is hardened     Results for orders placed or performed in visit on 04/19/15  Comprehensive metabolic panel  Result Value Ref Range   Glucose 123 (H) 65 - 99 mg/dL   BUN 8 6 - 20 mg/dL   Creatinine, Ser 4.09  0.76 - 1.27 mg/dL   GFR calc non Af Amer 106 >59 mL/min/1.73   GFR calc Af Amer 122 >59 mL/min/1.73   BUN/Creatinine Ratio 8 8 - 19   Sodium 140 134 - 144 mmol/L   Potassium 4.3 3.5 - 5.2 mmol/L   Chloride 97 96 - 106 mmol/L   CO2 25 18 - 29 mmol/L   Calcium 9.6 8.7 - 10.2 mg/dL   Total Protein 7.8 6.0 - 8.5 g/dL   Albumin 4.2 3.5 - 5.5 g/dL   Globulin, Total 3.6 1.5 - 4.5 g/dL   Albumin/Globulin Ratio 1.2 1.1 - 2.5   Bilirubin Total 0.4 0.0 - 1.2 mg/dL   Alkaline Phosphatase 117 39 - 117 IU/L   AST 24 0 - 40 IU/L   ALT 19 0 - 44 IU/L  Lipid Panel w/o Chol/HDL Ratio  Result Value Ref Range   Cholesterol, Total 171 100 - 199 mg/dL   Triglycerides 295 0 - 149 mg/dL   HDL 34 (L) >28 mg/dL   VLDL Cholesterol Cal 23 5 - 40 mg/dL   LDL Calculated 413 (H) 0 - 99 mg/dL   Hgb K4M w/o eAG  Result Value Ref Range   Hgb A1c MFr Bld 7.1 (H) 4.8 - 5.6 %  TSH  Result Value Ref Range   TSH 1.940 0.450 - 4.500 uIU/mL      Assessment & Plan:   Problem List Items Addressed This Visit      Cardiovascular and Mediastinum   Essential hypertension, benign    Significant weight loss obviously needed; referred in January to bariatric surgeon; DASH guidelines; stop / avoid decongestants; see AVS; on 10 mg amlodipine; 10/12.5 lisinopril/hctz; recheck at f/u; patient may have BP checked at pharmacy or here in the meantime, wrote down goal BP for hiim      Relevant Medications   lisinopril-hydrochlorothiazide (PRINZIDE,ZESTORETIC) 10-12.5 MG tablet     Endocrine   Type 2 diabetes mellitus (HCC) - Primary    Patient did not go to the diabetic educator appointment unfortunately; his morbid obesity is likely the cause for his diabetes; referred him to bariatric surgeon in January and will try to make sure staff is working on that referral      Relevant Medications   lisinopril-hydrochlorothiazide (PRINZIDE,ZESTORETIC) 10-12.5 MG tablet     Other   Morbid obesity (HCC)    I entered a referral to bariatric surgeon January 30th; hopeful that appt is being scheduled; asked patient to call us back and speak to staff if he has not heard back in one week; note that patient says we have not actually be able to weigh him here, so I'm not sure why there are readings listed; these are apparently estimates; we do not have a confirmed weight, but I do estimate he weighs around 500 pounds        Follow up plan: Return in about 3 months (around 07/19/2015) for thirty minute follow-up with fasting labs.  An after-visit summary was printed and given to the patient at check-out.  Please see the patient instructions which may contain other information and recommendations beyond what is mentioned above in the assessment and plan.

## 2015-05-14 ENCOUNTER — Other Ambulatory Visit: Payer: Self-pay | Admitting: Family Medicine

## 2015-05-14 MED ORDER — METFORMIN HCL ER 500 MG PO TB24
1000.0000 mg | ORAL_TABLET | Freq: Every evening | ORAL | Status: DC
Start: 2015-05-14 — End: 2015-07-21

## 2015-05-14 NOTE — Telephone Encounter (Signed)
Last cr and gfr reviewed; rx approved

## 2015-05-15 NOTE — Assessment & Plan Note (Addendum)
Significant weight loss obviously needed; referred in January to bariatric surgeon; DASH guidelines; stop / avoid decongestants; see AVS; on 10 mg amlodipine; 10/12.5 lisinopril/hctz; recheck at f/u; patient may have BP checked at pharmacy or here in the meantime, wrote down goal BP for hiim

## 2015-05-15 NOTE — Assessment & Plan Note (Signed)
Patient did not go to the diabetic educator appointment unfortunately; his morbid obesity is likely the cause for his diabetes; referred him to bariatric surgeon in January and will try to make sure staff is working on that referral

## 2015-05-15 NOTE — Assessment & Plan Note (Addendum)
I entered a referral to bariatric surgeon January 30th; hopeful that appt is being scheduled; asked patient to call us back and speak to staff if he has not heard back in one week; note that patient says we have not actually be able to weigh him here, so I'm not sure why there are readings listed; these are apparently estimates; we do not have a confirmed weight, but I do estimate he weighs around 500 pounds

## 2015-05-26 DIAGNOSIS — F17210 Nicotine dependence, cigarettes, uncomplicated: Secondary | ICD-10-CM | POA: Insufficient documentation

## 2015-05-27 ENCOUNTER — Other Ambulatory Visit: Payer: Self-pay | Admitting: Bariatrics

## 2015-06-02 ENCOUNTER — Telehealth: Payer: Self-pay | Admitting: *Deleted

## 2015-06-02 NOTE — Telephone Encounter (Signed)
Called patient to verify he has been contacted by Bariatric clinic. Left message for patient to call office.[place]

## 2015-06-14 ENCOUNTER — Ambulatory Visit
Admission: RE | Admit: 2015-06-14 | Discharge: 2015-06-14 | Disposition: A | Discharge status: Home / Self Care | Payer: Managed Care, Other (non HMO) | Source: Ambulatory Visit | Attending: Bariatrics | Admitting: Bariatrics

## 2015-06-14 ENCOUNTER — Other Ambulatory Visit: Payer: Self-pay | Admitting: Bariatrics

## 2015-06-14 DIAGNOSIS — K760 Fatty (change of) liver, not elsewhere classified: Secondary | ICD-10-CM | POA: Diagnosis not present

## 2015-06-25 ENCOUNTER — Encounter: Payer: Self-pay | Admitting: Cardiology

## 2015-06-25 ENCOUNTER — Ambulatory Visit (INDEPENDENT_AMBULATORY_CARE_PROVIDER_SITE_OTHER): Payer: Managed Care, Other (non HMO) | Admitting: Cardiology

## 2015-06-25 VITALS — BP 138/84 | HR 92 | Ht 74.0 in | Wt >= 6400 oz

## 2015-06-25 DIAGNOSIS — E0822 Diabetes mellitus due to underlying condition with diabetic chronic kidney disease: Secondary | ICD-10-CM | POA: Diagnosis not present

## 2015-06-25 DIAGNOSIS — I10 Essential (primary) hypertension: Secondary | ICD-10-CM

## 2015-06-25 DIAGNOSIS — Z87891 Personal history of nicotine dependence: Secondary | ICD-10-CM | POA: Insufficient documentation

## 2015-06-25 DIAGNOSIS — N181 Chronic kidney disease, stage 1: Secondary | ICD-10-CM

## 2015-06-25 DIAGNOSIS — Z01810 Encounter for preprocedural cardiovascular examination: Secondary | ICD-10-CM

## 2015-06-25 DIAGNOSIS — G4733 Obstructive sleep apnea (adult) (pediatric): Secondary | ICD-10-CM

## 2015-06-25 DIAGNOSIS — Z720 Tobacco use: Secondary | ICD-10-CM

## 2015-06-25 HISTORY — DX: Personal history of nicotine dependence: Z87.891

## 2015-06-25 MED ORDER — AMLODIPINE BESYLATE 10 MG PO TABS: 10.0000 mg | ORAL_TABLET | Freq: Every day | ORAL | Status: DC

## 2015-06-25 MED ORDER — LISINOPRIL-HYDROCHLOROTHIAZIDE 10-12.5 MG PO TABS: 1.0000 | ORAL_TABLET | Freq: Every day | ORAL | Status: DC

## 2015-06-25 NOTE — Patient Instructions (Signed)
Medication Instructions:  The current medical regimen is effective;  continue present plan and medications.  Testing/Procedures: Your physician has requested that you have an echocardiogram. Echocardiography is a painless test that uses sound waves to create images of your heart. It provides your doctor with information about the size and shape of your heart and how well your heart's chambers and valves are working. This procedure takes approximately one hour. There are no restrictions for this procedure.  Follow-Up: Follow up as needed after testing.  If you need a refill on your cardiac medications before your next appointment, please call your pharmacy.  Thank you for choosing [facility]!!     

## 2015-06-25 NOTE — Progress Notes (Signed)
Cardiology Office Note    Date:  06/25/2015   ID:  Walter Parks, DOB 04-May-1985, MRN [id number]  PCP:  Baruch Gouty, MD  Cardiologist:   Donato Schultz, MD     History of Present Illness:  Walter Parks is a 30 y.o. male with super morbid obesity, weighing approximate 550 pounds, diabetes, sleep apnea, hypertension here for preoperative risk gratification prior to bariatric surgery. He has been a moderate smoker as well. Half pack per day. No early family history of coronary artery disease. BMI 73.  A long discussion was had about lap sleeve gastrectomy as well as other potential surgical procedures and risks and benefits including long-term sequelae were discussed. No reported history of GERD regularly. No prior abdominal surgeries. Dr. Effie Shy. I do not have a copy of EKG previously done.   Overall he has not complained of any chest pain, no syncope, no bleeding, no significant shortness of breath other than mild baseline shortness of breath with his current weight. His maximum weight was 568 pounds and currently on our scales he is 530 pounds. Excellent. He seems quite motivated to lose weight. He has 2 children.  LDL 114, TSH 1.9, hemoglobin A1c 7.1, creatinine 0.9   Past Medical History  Diagnosis Date  . Obesity   . Diabetes mellitus without complication (HCC)   . Hypertension   . Sleep apnea     on an APAP machine  . GERD (gastroesophageal reflux disease)   . Depression     Past Surgical History  Procedure Laterality Date  . Tonsillectomy      Current Medications: Outpatient Prescriptions Prior to Visit  Medication Sig Dispense Refill  . metFORMIN (GLUCOPHAGE-XR) 500 MG 24 hr tablet Take 2 tablets (1,000 mg total) by mouth every evening. 60 tablet 5  . amLODipine (NORVASC) 10 MG tablet Take 1 tablet (10 mg total) by mouth daily. 90 tablet 1  . lisinopril-hydrochlorothiazide (PRINZIDE,ZESTORETIC) 10-12.5 MG tablet Take 1 tablet by mouth daily.  0   No  facility-administered medications prior to visit.     Allergies:   Review of patient's allergies indicates no known allergies.   Social History   Social History  . Marital Status: Single    Spouse Name: N/A  . Number of Children: N/A  . Years of Education: N/A   Social History Main Topics  . Smoking status: Current Every Day Smoker -- 0.25 packs/day for 18 years    Types: Cigarettes  . Smokeless tobacco: Never Used  . Alcohol Use: Yes     Comment: socially  . Drug Use: No  . Sexual Activity: Not Asked   Other Topics Concern  . None   Social History Narrative     Family History:  The patient's family history includes Diabetes in his cousin and maternal aunt; Hypertension in his father, maternal grandfather, maternal grandmother, mother, paternal grandfather, and paternal grandmother; Stroke in his paternal grandmother. There is no history of Heart disease or COPD.   ROS:   Please see the history of present illness.   No syncope, no bleeding, no orthopnea, no PND. Uses APAP. ROS All other systems reviewed and are negative.   PHYSICAL EXAM:   VS:  BP 138/84 mmHg  Pulse 92  Ht  (1.88 m)  Wt 530 lb 6.4 oz (240.588 kg)  BMI 68.07 kg/m2   GEN: Well nourished, well developed, in no acute distress HEENT: normal Neck: no JVD, carotid bruits, or masses, thick Cardiac: RRR; no  murmurs, rubs, or gallops,no edema  Respiratory:  clear to auscultation bilaterally, normal work of breathing GI: soft, nontender, nondistended, + BS, obese MS: no deformity or atrophy Skin: warm and dry, no rash Neuro:  Alert and Oriented x 3, Strength and sensation are intact Psych: euthymic mood, full affect  Wt Readings from Last 3 Encounters:  06/25/15 530 lb 6.4 oz (240.588 kg)  05/03/15 550 lb (249.478 kg)  04/19/15 558 lb (253.107 kg)      Studies/Labs Reviewed:   EKG:  EKG is ordered today.  The ekg ordered today demonstrates 06/25/15-sinus rhythm, 92, no other  abnormalities.  Recent Labs: 04/19/2015: ALT 19; BUN 8; Creatinine, Ser 0.97; Potassium 4.3; Sodium 140; TSH 1.940   Lipid Panel    Component Value Date/Time   CHOL 171 04/19/2015 0938   TRIG 115 04/19/2015 0938   HDL 34* 04/19/2015 0938   LDLCALC 114* 04/19/2015 0938    Additional studies/ records that were reviewed today include:  Referral office records, labs, chest x-ray, abdominal ultrasound reviewed  Chest x-ray personally reviewed shows no active disease. Cardiac size was normal.  Abdominal ultrasound showed fatty liver infiltration otherwise normal.    ASSESSMENT:    1. Pre-operative cardiovascular examination   2. Morbid obesity due to excess calories (HCC)   3. Essential hypertension   4. Diabetes mellitus due to underlying condition with stage 1 chronic kidney disease, without long-term current use of insulin (HCC)   5. Tobacco use   6. OSA (obstructive sleep apnea)      PLAN:  In order of problems listed above:  Preoperative cardiovascular risk stratification prior to bariatric surgery -We will check an echocardiogram to ensure proper structure and function of his heart. I explained to him that I don't want any surprises prior to anesthesia. If overall ejection fraction is normal, he may proceed. -He seems able to complete greater than 4 METS of activity without difficulty. Based upon this, he should be able to proceed with bariatric surgery with overall low to moderate cardiovascular risk stemed basically from his super morbid obesity. There is no cardiac intervention that would reduce overall risk. He understands his risks and is willing to proceed. Significant weight loss over time would be of immense benefit for him from future cardiovascular events.  Essential hypertension -Medications reviewed as above. Well-controlled.  Diabetes mellitus -Hemoglobin A1c 7.1 -Metformin per primary team.  Tobacco use -Currently using Chantix. Doing well. Only seeing  mild nausea with this medicine. Motivated to quit.  Obstructive sleep apnea -Uses APAP   Medication Adjustments/Labs and Tests Ordered: Current medicines are reviewed at length with the patient today.  Concerns regarding medicines are outlined above.  Medication changes, Labs and Tests ordered today are listed in the Patient Instructions below. Patient Instructions  Medication Instructions:  The current medical regimen is effective;  continue present plan and medications.  Testing/Procedures: Your physician has requested that you have an echocardiogram. Echocardiography is a painless test that uses sound waves to create images of your heart. It provides your doctor with information about the size and shape of your heart and how well your heart's chambers and valves are working. This procedure takes approximately one hour. There are no restrictions for this procedure.  Follow-Up: Follow up as needed after testing.  If you need a refill on your cardiac medications before your next appointment, please call your pharmacy.  Thank you for choosing [facility]!!           Signed,  Donato Schultz, MD  06/25/2015 12:11 PM    [facility] [address] [place], [place], [place]  [address] Phone: [phone]; Fax: [phone]

## 2015-07-14 ENCOUNTER — Other Ambulatory Visit: Payer: Self-pay

## 2015-07-14 ENCOUNTER — Ambulatory Visit (HOSPITAL_COMMUNITY): Payer: Managed Care, Other (non HMO) | Attending: Cardiovascular Disease

## 2015-07-14 DIAGNOSIS — I119 Hypertensive heart disease without heart failure: Secondary | ICD-10-CM | POA: Diagnosis not present

## 2015-07-14 DIAGNOSIS — Z01810 Encounter for preprocedural cardiovascular examination: Secondary | ICD-10-CM | POA: Diagnosis not present

## 2015-07-14 DIAGNOSIS — E119 Type 2 diabetes mellitus without complications: Secondary | ICD-10-CM | POA: Insufficient documentation

## 2015-07-14 DIAGNOSIS — Z720 Tobacco use: Secondary | ICD-10-CM | POA: Insufficient documentation

## 2015-07-14 DIAGNOSIS — Z6841 Body Mass Index (BMI) 40.0 and over, adult: Secondary | ICD-10-CM | POA: Diagnosis not present

## 2015-07-21 ENCOUNTER — Encounter: Payer: Self-pay | Admitting: Cardiology

## 2015-07-21 ENCOUNTER — Other Ambulatory Visit: Payer: Self-pay

## 2015-07-21 MED ORDER — METFORMIN HCL ER 500 MG PO TB24: 1000.0000 mg | ORAL_TABLET | Freq: Every evening | ORAL | Status: DC

## 2015-07-21 NOTE — Telephone Encounter (Signed)
Routing to provider. He is following you to [facility]. Pharmacy requesting 90 day supply.

## 2015-07-21 NOTE — Telephone Encounter (Signed)
This encounter was created in error - please disregard.

## 2015-07-21 NOTE — Telephone Encounter (Signed)
Last Cr reviewed; approved

## 2015-07-21 NOTE — Telephone Encounter (Signed)
Follow Up  ° °Pt returned call for ECHO results  °

## 2015-08-02 ENCOUNTER — Encounter: Payer: Self-pay | Admitting: Family Medicine

## 2015-08-02 ENCOUNTER — Ambulatory Visit (INDEPENDENT_AMBULATORY_CARE_PROVIDER_SITE_OTHER): Payer: Managed Care, Other (non HMO) | Admitting: Family Medicine

## 2015-08-02 VITALS — BP 140/86 | HR 92 | Temp 98.0°F | Resp 18 | Ht 74.0 in | Wt >= 6400 oz

## 2015-08-02 DIAGNOSIS — Z87891 Personal history of nicotine dependence: Secondary | ICD-10-CM

## 2015-08-02 DIAGNOSIS — E119 Type 2 diabetes mellitus without complications: Secondary | ICD-10-CM | POA: Diagnosis not present

## 2015-08-02 DIAGNOSIS — I10 Essential (primary) hypertension: Secondary | ICD-10-CM

## 2015-08-02 DIAGNOSIS — Z5181 Encounter for therapeutic drug level monitoring: Secondary | ICD-10-CM

## 2015-08-02 NOTE — Assessment & Plan Note (Signed)
So glad he has quit smoking; encouragement given

## 2015-08-02 NOTE — Progress Notes (Signed)
BP 140/86 mmHg  Pulse 92  Temp(Src) 98 F (36.7 C) (Oral)  Resp 18  Ht 6\' 2"  (1.88 m)  Wt 518 lb (234.963 kg)  BMI 66.48 kg/m2  SpO2 98%   Subjective:    Patient ID: Walter Parks, male    DOB: 1985-06-05, 30 y.o.   MRN: [id number]  HPI: Walter Parks is a 30 y.o. male  Chief Complaint  Patient presents with  . Follow-up    3 month  . Labs Only   Morbid obesity; peak weight 568 pounds and down today to 518 pounds; cut out soda; he has a goal to lose 120 pounds over the coming year after surgery; no specific goal for 2-3 years down the road  Quit smoking yesterday; on Chantix; no weird dreams, just some nausea at first  Diabetes; not checking sugars regularly with my blessing; no dry mouth; no SHOB or chest pain; no sores on the feet, just some calluses  HTN; not checking; just took medicine right before coming; nervous about scale   Depression screen PHQ 2/9 08/02/2015  Decreased Interest 0  Down, Depressed, Hopeless 0  PHQ - 2 Score 0   Relevant past medical, surgical, family and social history reviewed Past Medical History  Diagnosis Date  . Obesity   . Diabetes mellitus without complication (HCC)   . Hypertension   . Sleep apnea     on an APAP machine  . GERD (gastroesophageal reflux disease)   . Depression   . Hx of tobacco use, presenting hazards to health 06/25/2015   Past Surgical History  Procedure Laterality Date  . Tonsillectomy     Family History  Problem Relation Age of Onset  . Hypertension Mother   . Hypertension Father   . Diabetes Maternal Aunt   . Hypertension Maternal Grandmother   . Hypertension Maternal Grandfather   . Stroke Paternal Grandmother   . Hypertension Paternal Grandmother   . Hypertension Paternal Grandfather   . Heart disease Neg Hx   . COPD Neg Hx   . Diabetes Cousin    Social History  Substance Use Topics  . Smoking status: Former Smoker -- 0.00 packs/day for 18 years    Types: Cigarettes    Quit date: 08/01/2015  .  Smokeless tobacco: Never Used  . Alcohol Use: 0.0 oz/week    0 Standard drinks or equivalent per week     Comment: socially   Interim medical history since last visit reviewed. Allergies and medications reviewed  Review of Systems Per HPI unless specifically indicated above     Objective:    BP 140/86 mmHg  Pulse 92  Temp(Src) 98 F (36.7 C) (Oral)  Resp 18  Ht 6\' 2"  (1.88 m)  Wt 518 lb (234.963 kg)  BMI 66.48 kg/m2  SpO2 98%  Wt Readings from Last 3 Encounters:  08/02/15 518 lb (234.963 kg)  06/25/15 530 lb 6.4 oz (240.588 kg)  05/03/15 550 lb (249.478 kg)    Physical Exam  Constitutional: He appears well-developed and well-nourished.  Morbidly obese; down 32 pounds since my last visit with him 3 months ago (intentional)  Eyes: EOM are normal. No scleral icterus.  Neck: No JVD present.  Cardiovascular: Regular rhythm.   Heart sounds are distant due to morbid obesity  Pulmonary/Chest: Effort normal. He has decreased breath sounds.  Decreased breath sounds throughout secondary to morbid obesity  Abdominal: Soft. Bowel sounds are normal. There is no tenderness.  Musculoskeletal: He exhibits no edema.  Right foot: There is deformity (pes planus).       Left foot: There is deformity (pes planus).  Neurological: He is alert.  Skin: Skin is warm. No pallor.  Psychiatric: He has a normal mood and affect.   Diabetic Foot Form - Detailed   Diabetic Foot Exam - detailed  Diabetic Foot exam was performed with the following findings:  Yes 08/02/2015 10:13 AM  Visual Foot Exam completed.:  Yes  Are the toenails long?:  No  Are the toenails thick?:  No  Are the toenails ingrown?:  No (Comment: remnants of toenails growing back both big toes)  Normal Range of Motion:  Yes    Pulse Foot Exam completed.:  Yes  Right Dorsalis Pedis:  Present Left Dorsalis Pedis:  Present  Sensory Foot Exam Completed.:  Yes  Swelling:  No  Semmes-Weinstein Monofilament Test  R Site 1-Great  Toe:  Pos L Site 1-Great Toe:  Pos  R Site 4:  Pos L Site 4:  Pos  R Site 5:  Pos L Site 5:  Pos    Comments:  Dry skin both soles, no fissures or calluses      Results for orders placed or performed in visit on 04/19/15  Comprehensive metabolic panel  Result Value Ref Range   Glucose 123 (H) 65 - 99 mg/dL   BUN 8 6 - 20 mg/dL   Creatinine, Ser 6.96 0.76 - 1.27 mg/dL   GFR calc non Af Amer 106 >59 mL/min/1.73   GFR calc Af Amer 122 >59 mL/min/1.73   BUN/Creatinine Ratio 8 8 - 19   Sodium 140 134 - 144 mmol/L   Potassium 4.3 3.5 - 5.2 mmol/L   Chloride 97 96 - 106 mmol/L   CO2 25 18 - 29 mmol/L   Calcium 9.6 8.7 - 10.2 mg/dL   Total Protein 7.8 6.0 - 8.5 g/dL   Albumin 4.2 3.5 - 5.5 g/dL   Globulin, Total 3.6 1.5 - 4.5 g/dL   Albumin/Globulin Ratio 1.2 1.1 - 2.5   Bilirubin Total 0.4 0.0 - 1.2 mg/dL   Alkaline Phosphatase 117 39 - 117 IU/L   AST 24 0 - 40 IU/L   ALT 19 0 - 44 IU/L  Lipid Panel w/o Chol/HDL Ratio  Result Value Ref Range   Cholesterol, Total 171 100 - 199 mg/dL   Triglycerides 295 0 - 149 mg/dL   HDL 34 (L) >28 mg/dL   VLDL Cholesterol Cal 23 5 - 40 mg/dL   LDL Calculated 413 (H) 0 - 99 mg/dL  Hgb K4M w/o eAG  Result Value Ref Range   Hgb A1c MFr Bld 7.1 (H) 4.8 - 5.6 %  TSH  Result Value Ref Range   TSH 1.940 0.450 - 4.500 uIU/mL      Assessment & Plan:   Problem List Items Addressed This Visit      Cardiovascular and Mediastinum   Essential hypertension, benign    Not quite to goal today, but he just took his medicine and was anxious about scale; as he loses weight and stays off of cigarettes, I only expect this to improved; DASH guidelines reinforced      Relevant Medications   lisinopril (PRINIVIL,ZESTRIL) 10 MG tablet     Endocrine   Type 2 diabetes mellitus (HCC) - Primary    Check A1c today; foot exam done, pt to check feet every night; last eye doctor visit a while ago, pt will call and make appt  Relevant Medications    lisinopril (PRINIVIL,ZESTRIL) 10 MG tablet   Other Relevant Orders   Hemoglobin A1c   Lipid Panel w/o Chol/HDL Ratio     Other   Hx of tobacco use, presenting hazards to health    So glad he has quit smoking; encouragement given      Medication monitoring encounter    Check Cr and K+      Relevant Orders   Comprehensive metabolic panel   Morbid obesity (HCC)    He has lost 50 pounds! Praise given; looking forward to bariatric surgery         Follow up plan: Return in about 6 months (around 02/02/2016) for fasting labs and visit.  An after-visit summary was printed and given to the patient at check-out.  Please see the patient instructions which may contain other information and recommendations beyond what is mentioned above in the assessment and plan.  Meds ordered this encounter  Medications  . lisinopril (PRINIVIL,ZESTRIL) 10 MG tablet    Sig: Take 10 mg by mouth daily.    Refill:  1    Orders Placed This Encounter  Procedures  . Hemoglobin A1c  . Lipid Panel w/o Chol/HDL Ratio  . Comprehensive metabolic panel

## 2015-08-02 NOTE — Assessment & Plan Note (Signed)
He has lost 50 pounds! Praise given; looking forward to bariatric surgery

## 2015-08-02 NOTE — Assessment & Plan Note (Signed)
Check A1c today; foot exam done, pt to check feet every night; last eye doctor visit a while ago, pt will call and make appt

## 2015-08-02 NOTE — Assessment & Plan Note (Signed)
Not quite to goal today, but he just took his medicine and was anxious about scale; as he loses weight and stays off of cigarettes, I only expect this to improved; DASH guidelines reinforced

## 2015-08-02 NOTE — Patient Instructions (Signed)
Please do see your eye doctor regularly, and have your eyes examined every year (or more often per his or her recommendation) Check your feet every night and let me know right away of any sores, infections, numbness, etc. Try to limit sweets, white bread, white rice, white potatoes It is okay with me for you to not check your fingerstick blood sugars (per [employer] Best Practices), unless you are interested and feel it would be helpful for you  I am sooooooooo proud of you quitting smoking and your weight loss efforts  Have fasting labs done soon at your convenience  Return in 3 months if A1c is 7 or higher, but if controlled, I can see you in 6 months

## 2015-08-02 NOTE — Assessment & Plan Note (Signed)
Check Cr and K+ 

## 2015-08-12 ENCOUNTER — Other Ambulatory Visit: Payer: Self-pay | Admitting: Family Medicine

## 2015-08-12 NOTE — Telephone Encounter (Signed)
Still awaiting lab results ordered earlier this month

## 2015-10-25 ENCOUNTER — Encounter
Admission: RE | Admit: 2015-10-25 | Discharge: 2015-10-25 | Disposition: A | Discharge status: Home / Self Care | Payer: Managed Care, Other (non HMO) | Source: Ambulatory Visit | Attending: Bariatrics | Admitting: Bariatrics

## 2015-10-25 ENCOUNTER — Encounter: Payer: Self-pay | Admitting: *Deleted

## 2015-10-25 NOTE — Patient Instructions (Signed)
  Your procedure is scheduled on: 11-02-15 (TUESDAY) Report to Same Day Surgery 2nd floor medical mall To find out your arrival time please call [phone] between 1PM - 3PM on 11-01-15 [facility](MONDAY)  Remember: Instructions that are not followed completely may result in serious medical risk, up to and including death, or upon the discretion of your surgeon and anesthesiologist your surgery may need to be rescheduled.    _x___ 1. Do not eat food or drink liquids after midnight. No gum chewing or hard candies.     __x__ 2. No Alcohol for 24 hours before or after surgery.   __x__3. No Smoking for 24 prior to surgery.   ____  4. Bring all medications with you on the day of surgery if instructed.    __x__ 5. Notify your doctor if there is any change in your medical condition     (cold, fever, infections).     Do not wear jewelry, make-up, hairpins, clips or nail polish.  Do not wear lotions, powders, or perfumes. You may wear deodorant.  Do not shave 48 hours prior to surgery. Men may shave face and neck.  Do not bring valuables to the hospital.    [facility] is not responsible for any belongings or valuables.               Contacts, dentures or bridgework may not be worn into surgery.  Leave your suitcase in the car. After surgery it may be brought to your room.  For patients admitted to the hospital, discharge time is determined by your treatment team.   Patients discharged the day of surgery will not be allowed to drive home.    Please read over the following fact sheets that you were given:   [facility] Preparing for Surgery and or MRSA Information   _x___ Take these medicines the morning of surgery with A SIP OF WATER:    1. AMLODIPINE  2. LISINOPRIL  3.  4.  5.  6.  ____ Fleet Enema (as directed)   _x___ Use CHG Soap or sage wipes as directed on instruction sheet   ____ Use inhalers on the day of surgery and bring to hospital day of surgery  _X___ Stop metformin 2 days  prior to surgery-LAST DOSE ON Saturday, 10-30-15    ____ Take 1/2 of usual insulin dose the night before surgery and none on the morning of  surgery.   ____ Stop aspirin or coumadin, or plavix  _x__ Stop Anti-inflammatories such as Advil, Aleve, Ibuprofen, Motrin, Naproxen,          Naprosyn, Goodies powders or aspirin products. Ok to take Tylenol.   _X___ Stop supplements until after surgery-STOP VITAMIN A NOW  ____ Bring C-Pap to the hospital.

## 2015-10-25 NOTE — Pre-Procedure Instructions (Signed)
Notes Recorded by Jake Bathe, MD on 07/16/2015 at 6:29 AM Excellent EF, normal. Based upon this, he should be able to proceed with bariatric surgery with overall low to moderate cardiovascular risk stemed basically from his super morbid obesity. There is no cardiac intervention that would reduce overall risk. He understands his risks and is willing to proceed. Significant weight loss over time would be of immense benefit for him from future cardiovascular events.  Donato Schultz, MD      Study Result   Result status: Final result                           [facility]                        [address]                        [place], [place] [address]                            [phone]  ------------------------------------------------------------------- Transthoracic Echocardiography  Patient:    Walter, Parks MR #:       [id number] Study Date: 07/14/2015 Gender:     M Age:        29 Height:     188 cm Weight:     240.6 kg BSA:        3.69 m^2 Pt. Status: Room:   SONOGRAPHER  Vergia Alcon  Althea Grimmer, M.D.  REFERRING    Donato Schultz, M.D.  ATTENDING    Mihai Croitoru, MD  PERFORMING   [facility]  cc:  ------------------------------------------------------------------- LV EF: 55% -   60%  ------------------------------------------------------------------- Indications:      (Z01.810).  ------------------------------------------------------------------- History:   PMH:  Acquired from the patient and from the patient&'s chart.  Risk factors:  Current tobacco use. Hypertension. Diabetes mellitus. Morbidly obese.  ------------------------------------------------------------------- Study Conclusions  - Left ventricle: The cavity size was normal. Wall thickness was   normal. Systolic function was normal. The estimated ejection   fraction was in the range of 55% to 60%. Although no diagnostic   regional wall motion abnormality  was identified, this possibility   cannot be completely excluded on the basis of this study. Left   ventricular diastolic function parameters were normal. - Left atrium: The atrium was mildly dilated.  Transthoracic echocardiography.  M-mode, complete 2D, spectral Doppler, and color Doppler.  Birthdate:  Patient birthdate: Sep 07, 1985.  Age:  Patient is 30 yr old.  Sex:  Gender: male. BMI: 68.1 kg/m^2.  Blood pressure:     138/84  Patient status: Outpatient.  Study date:  Study date: 07/14/2015. Study time: 10:39 AM.  Location:  [facility]  -------------------------------------------------------------------  ------------------------------------------------------------------- Left ventricle:  The cavity size was normal. Wall thickness was normal. Systolic function was normal. The estimated ejection fraction was in the range of 55% to 60%. Although no diagnostic regional wall motion abnormality was identified, this possibility cannot be completely excluded on the basis of this study. The transmitral flow pattern was normal. The deceleration time of the early transmitral flow velocity was normal. The pulmonary vein flow pattern was normal. The tissue Doppler parameters were normal. Left ventricular diastolic function parameters were normal.  ------------------------------------------------------------------- Aortic valve:   Trileaflet; normal thickness leaflets. Mobility  was not restricted.  Doppler:  Transvalvular velocity was within the normal range. There was no stenosis. There was no regurgitation.   ------------------------------------------------------------------- Aorta:  Aortic root: The aortic root was normal in size.  ------------------------------------------------------------------- Mitral valve:   Structurally normal valve.   Mobility was not restricted.  Doppler:  Transvalvular velocity was within the normal range. There was no evidence for stenosis. There  was no regurgitation.    Peak gradient (D): 5 mm Hg.  ------------------------------------------------------------------- Left atrium:  The atrium was mildly dilated.  ------------------------------------------------------------------- Right ventricle:  The cavity size was normal. Wall thickness was normal. Systolic function was normal.  ------------------------------------------------------------------- Pulmonic valve:    Doppler:  Transvalvular velocity was within the normal range. There was no evidence for stenosis.  ------------------------------------------------------------------- Tricuspid valve:   Structurally normal valve.    Doppler: Transvalvular velocity was within the normal range. There was no regurgitation.  ------------------------------------------------------------------- Pulmonary artery:   The main pulmonary artery was normal-sized. Systolic pressure was within the normal range.  ------------------------------------------------------------------- Right atrium:  The atrium was normal in size.  ------------------------------------------------------------------- Pericardium:  There was no pericardial effusion.  ------------------------------------------------------------------- Systemic veins: Inferior vena cava: The vessel was normal in size. The respirophasic diameter changes were in the normal range (>= 50%), consistent with normal central venous pressure.  ------------------------------------------------------------------- Measurements   Left ventricle                         Value        Reference  LV ID, ED, PLAX chordal                48.8  mm     43 - 52  LV ID, ES, PLAX chordal                34.4  mm     23 - 38  LV fx shortening, PLAX chordal         30    %      >=29  LV PW thickness, ED                    11.58 mm     ---------  IVS/LV PW ratio, ED                    0.96         <=1.3  Stroke volume, 2D                      85    ml      ---------  Stroke volume/bsa, 2D                  23    ml/m^2 ---------  LV e&', lateral                         15.7  cm/s   ---------  LV E/e&', lateral                       6.88         ---------  LV e&', medial                          11.6  cm/s   ---------  LV E/e&', medial  9.31         ---------  LV e&', average                         13.65 cm/s   ---------  LV E/e&', average                       7.91         ---------    Ventricular septum                     Value        Reference  IVS thickness, ED                      11.1  mm     ---------    LVOT                                   Value        Reference  LVOT ID, S                             24    mm     ---------  LVOT area                              4.52  cm^2   ---------  LVOT ID                                24    mm     ---------  LVOT peak velocity, S                  96.3  cm/s   ---------  LVOT mean velocity, S                  62.6  cm/s   ---------  LVOT VTI, S                            18.7  cm     ---------  LVOT peak gradient, S                  4     mm Hg  ---------  Stroke volume (SV), LVOT DP            84.6  ml     ---------  Stroke index (SV/bsa), LVOT DP         22.9  ml/m^2 ---------    Aorta                                  Value        Reference  Aortic root ID, ED                     34    mm     ---------  Ascending aorta ID, A-P, S             32    mm     ---------    Left atrium  Value        Reference  LA ID, A-P, ES                         42    mm     ---------  LA ID/bsa, A-P                         1.14  cm/m^2 <=2.2  LA volume, S                           94    ml     ---------  LA volume/bsa, S                       25.5  ml/m^2 ---------  LA volume, ES, 1-p A4C                 97    ml     ---------  LA volume/bsa, ES, 1-p A4C             26.3  ml/m^2 ---------  LA volume, ES, 1-p A2C                 85    ml     ---------   LA volume/bsa, ES, 1-p A2C             23    ml/m^2 ---------    Mitral valve                           Value        Reference  Mitral E-wave peak velocity            108   cm/s   ---------  Mitral A-wave peak velocity            72.1  cm/s   ---------  Mitral deceleration time       (H)     303   ms     150 - 230  Mitral peak gradient, D                5     mm Hg  ---------  Mitral E/A ratio, peak                 1.5          ---------    Systemic veins                         Value        Reference  Estimated CVP                          8     mm Hg  ---------    Right ventricle                        Value        Reference  RV s&', lateral, S                      16.5  cm/s   ---------  Legend: (Parks)  and  (H)  mark values outside specified reference range.  ------------------------------------------------------------------- Prepared and Electronically Authenticated by  Rachelle Hora  Croitoru, MD 2017-04-26T14:07:43  PACS Images   Show images for Echocardiogram  Patient Information   Patient Name Walter Parks, Walter Parks Sex Male DOB 1985-05-01 SSN [id number]  Reason For Exam  Priority: Routine  Pre-operative cardiovascular examination V72.81 / Z01.810  Dx: Pre-operative cardiovascular examination [Z01.810 (ICD-10-CM)]; Morbid obesity due to excess calories (HCC) [E66.01 (ICD-10-CM)]  Surgical History   Surgical History   No past medical history on file.    Other Surgical History   Procedure Laterality Date Comment Source  TONSILLECTOMY    Provider    Performing Technologist/Nurse   Performing Technologist/Nurse: Rutherford NailWilliam S Edwards                    Implants     No active implants to display in this view.  Order-Level Documents:   There are no order-level documents.  Encounter-Level Documents - 07/14/2015:   Scan on 07/23/2015 11:42 AM by Provider Default, MD  Electronic signature on 07/14/2015 10:30 AM  Electronic signature on 07/14/2015 10:30 AM      Signed    Electronically signed by Thurmon FairMihai Croitoru, MD on 07/14/15 at 1408 EDT  Printable Result Report   Result Report  [facility]   Reviewed results via telephone.   Excellent ejection fraction (heart muscle pumping function). It is normal at 55-60%.   You should be able to proceed with bariatric surgery with overall low to moderate cardiovascular risks.  Echocardiogram (Order [id number])  Echocardiography  Date: 07/14/2015 [facility] [address] Released By: Cleon GustinAlicia A Carter Authorizing: Jake BatheMark C Skains, MD  Procedure Abnormality Status  Echocardiogram

## 2015-10-29 ENCOUNTER — Encounter
Admission: RE | Admit: 2015-10-29 | Discharge: 2015-10-29 | Disposition: A | Discharge status: Home / Self Care | Payer: Managed Care, Other (non HMO) | Source: Ambulatory Visit | Attending: Bariatrics | Admitting: Bariatrics

## 2015-10-29 DIAGNOSIS — Z01812 Encounter for preprocedural laboratory examination: Secondary | ICD-10-CM | POA: Diagnosis not present

## 2015-10-29 LAB — BASIC METABOLIC PANEL
ANION GAP: 8 (ref 5–15)
BUN: 15 mg/dL (ref 6–20)
CALCIUM: 9.5 mg/dL (ref 8.9–10.3)
CHLORIDE: 101 mmol/L (ref 101–111)
CO2: 27 mmol/L (ref 22–32)
Creatinine, Ser: 0.86 mg/dL (ref 0.61–1.24)
GFR calc non Af Amer: >60 mL/min (ref >60)
Glucose, Bld: 91 mg/dL (ref 65–99)
POTASSIUM: 4.1 mmol/L (ref 3.5–5.1)
Sodium: 136 mmol/L (ref 135–145)

## 2015-10-29 LAB — TYPE AND SCREEN
ABO/RH(D): A POS
Antibody Screen: NEGATIVE

## 2015-11-01 NOTE — Pre-Procedure Instructions (Signed)
KRYSTAL FROM DR Jannett CelestineYNERS OFFICE CALLED BACK AND SAID THAT DR Derrell LollingYNER WANTS EMEND DC'D  AND DOES NOT WANT ANYTHING ORDERED IN ITS PLACE

## 2015-11-01 NOTE — Pre-Procedure Instructions (Signed)
Called Dr Jannett Celestineyners office regarding Emend 40 mg that he has ordered preop- our pharmacy does not carry 40 mg, only 150 mg IV-gave his office this message and my return # if med needs to be changed

## 2015-11-02 ENCOUNTER — Encounter
Admission: RE | Disposition: A | Discharge status: Home / Self Care | Payer: Self-pay | Source: Ambulatory Visit | Attending: Bariatrics

## 2015-11-02 ENCOUNTER — Inpatient Hospital Stay: Payer: Managed Care, Other (non HMO) | Admitting: Anesthesiology

## 2015-11-02 ENCOUNTER — Observation Stay
Admission: RE | Admit: 2015-11-02 | Discharge: 2015-11-03 | Disposition: A | Discharge status: Home / Self Care | Payer: Managed Care, Other (non HMO) | Source: Ambulatory Visit | Attending: Bariatrics | Admitting: Bariatrics

## 2015-11-02 ENCOUNTER — Encounter: Payer: Self-pay | Admitting: Anesthesiology

## 2015-11-02 DIAGNOSIS — Z79899 Other long term (current) drug therapy: Secondary | ICD-10-CM | POA: Diagnosis not present

## 2015-11-02 DIAGNOSIS — Z823 Family history of stroke: Secondary | ICD-10-CM | POA: Diagnosis not present

## 2015-11-02 DIAGNOSIS — G4733 Obstructive sleep apnea (adult) (pediatric): Secondary | ICD-10-CM | POA: Diagnosis not present

## 2015-11-02 DIAGNOSIS — Z7984 Long term (current) use of oral hypoglycemic drugs: Secondary | ICD-10-CM | POA: Diagnosis not present

## 2015-11-02 DIAGNOSIS — I10 Essential (primary) hypertension: Secondary | ICD-10-CM | POA: Insufficient documentation

## 2015-11-02 DIAGNOSIS — Z8261 Family history of arthritis: Secondary | ICD-10-CM | POA: Insufficient documentation

## 2015-11-02 DIAGNOSIS — F17200 Nicotine dependence, unspecified, uncomplicated: Secondary | ICD-10-CM | POA: Diagnosis not present

## 2015-11-02 DIAGNOSIS — E669 Obesity, unspecified: Secondary | ICD-10-CM | POA: Diagnosis present

## 2015-11-02 DIAGNOSIS — E119 Type 2 diabetes mellitus without complications: Secondary | ICD-10-CM | POA: Insufficient documentation

## 2015-11-02 DIAGNOSIS — Z8249 Family history of ischemic heart disease and other diseases of the circulatory system: Secondary | ICD-10-CM | POA: Insufficient documentation

## 2015-11-02 DIAGNOSIS — Z6841 Body Mass Index (BMI) 40.0 and over, adult: Principal | ICD-10-CM | POA: Insufficient documentation

## 2015-11-02 HISTORY — PX: LAPAROSCOPIC GASTRIC RESTRICTIVE DUODENAL PROCEDURE (DUODENAL SWITCH): SHX6667

## 2015-11-02 LAB — GLUCOSE, CAPILLARY
GLUCOSE-CAPILLARY: 92 mg/dL (ref 65–99)
Glucose-Capillary: 95 mg/dL (ref 65–99)
Glucose-Capillary: 165 mg/dL — ABNORMAL HIGH (ref 65–99)
Glucose-Capillary: 107 mg/dL — ABNORMAL HIGH (ref 65–99)

## 2015-11-02 SURGERY — LAPAROSCOPIC GASTRIC RESTRICTIVE DUODENAL PROCEDURE (DUODENAL SWITCH)
Anesthesia: General | Wound class: Clean Contaminated

## 2015-11-02 MED ORDER — ACETAMINOPHEN 160 MG/5ML PO SOLN: 325.0000 mg | ORAL | Status: DC | PRN

## 2015-11-02 MED ORDER — FENTANYL CITRATE (PF) 100 MCG/2ML IJ SOLN
INTRAMUSCULAR | Status: AC
  Administered 2015-11-02: 25 ug via INTRAVENOUS
  Filled 2015-11-02: qty 2

## 2015-11-02 MED ORDER — ONDANSETRON HCL 4 MG/2ML IJ SOLN
4.0000 mg | INTRAMUSCULAR | Status: DC | PRN
Start: 2015-11-02 — End: 2015-11-03
  Administered 2015-11-02 (×2): 4 mg via INTRAVENOUS
  Filled 2015-11-02 (×2): qty 2

## 2015-11-02 MED ORDER — LIDOCAINE HCL (CARDIAC) 20 MG/ML IV SOLN
INTRAVENOUS | Status: DC | PRN
  Administered 2015-11-02: 100 mg via INTRAVENOUS

## 2015-11-02 MED ORDER — PROMETHAZINE HCL 25 MG/ML IJ SOLN: 12.5000 mg | Freq: Four times a day (QID) | INTRAMUSCULAR | Status: DC | PRN

## 2015-11-02 MED ORDER — AMLODIPINE BESYLATE 10 MG PO TABS
10.0000 mg | ORAL_TABLET | Freq: Every day | ORAL | Status: DC
  Filled 2015-11-02: qty 1

## 2015-11-02 MED ORDER — ENOXAPARIN SODIUM 40 MG/0.4ML ~~LOC~~ SOLN
40.0000 mg | Freq: Two times a day (BID) | SUBCUTANEOUS | Status: DC
  Administered 2015-11-03: 40 mg via SUBCUTANEOUS
  Filled 2015-11-02: qty 0.4

## 2015-11-02 MED ORDER — LISINOPRIL 10 MG PO TABS
10.0000 mg | ORAL_TABLET | Freq: Every day | ORAL | Status: DC
  Filled 2015-11-02: qty 1

## 2015-11-02 MED ORDER — ENALAPRILAT 1.25 MG/ML IV SOLN
0.6250 mg | Freq: Four times a day (QID) | INTRAVENOUS | Status: DC | PRN
  Filled 2015-11-02: qty 0.5

## 2015-11-02 MED ORDER — ACETAMINOPHEN 10 MG/ML IV SOLN
INTRAVENOUS | Status: DC | PRN
  Administered 2015-11-02: 1000 mg via INTRAVENOUS

## 2015-11-02 MED ORDER — DEXAMETHASONE SODIUM PHOSPHATE 10 MG/ML IJ SOLN
INTRAMUSCULAR | Status: DC | PRN
  Administered 2015-11-02: 10 mg via INTRAVENOUS

## 2015-11-02 MED ORDER — SCOPOLAMINE 1 MG/3DAYS TD PT72
MEDICATED_PATCH | TRANSDERMAL | Status: AC
  Filled 2015-11-02: qty 1

## 2015-11-02 MED ORDER — ONDANSETRON HCL 4 MG/2ML IJ SOLN: 4.0000 mg | Freq: Once | INTRAMUSCULAR | Status: DC | PRN

## 2015-11-02 MED ORDER — GLYCOPYRROLATE 0.2 MG/ML IJ SOLN
INTRAMUSCULAR | Status: DC | PRN
  Administered 2015-11-02: 0.2 mg via INTRAVENOUS

## 2015-11-02 MED ORDER — DEXMEDETOMIDINE HCL IN NACL 200 MCG/50ML IV SOLN
INTRAVENOUS | Status: DC | PRN
  Administered 2015-11-02 (×2): 8 ug via INTRAVENOUS
  Administered 2015-11-02 (×3): 24 ug via INTRAVENOUS

## 2015-11-02 MED ORDER — SCOPOLAMINE 1 MG/3DAYS TD PT72
1.0000 | MEDICATED_PATCH | Freq: Once | TRANSDERMAL | Status: DC
  Administered 2015-11-02: 1.5 mg via TRANSDERMAL

## 2015-11-02 MED ORDER — ROCURONIUM BROMIDE 100 MG/10ML IV SOLN
INTRAVENOUS | Status: DC | PRN
  Administered 2015-11-02 (×3): 20 mg via INTRAVENOUS
  Administered 2015-11-02 (×2): 5 mg via INTRAVENOUS
  Administered 2015-11-02: 10 mg via INTRAVENOUS

## 2015-11-02 MED ORDER — SODIUM CHLORIDE 0.9 % IV SOLN
INTRAVENOUS | Status: DC
Start: 2015-11-02 — End: 2015-11-02
  Administered 2015-11-02 (×2): via INTRAVENOUS

## 2015-11-02 MED ORDER — BUPIVACAINE-EPINEPHRINE (PF) 0.25% -1:200000 IJ SOLN
INTRAMUSCULAR | Status: AC
  Filled 2015-11-02: qty 60

## 2015-11-02 MED ORDER — HYDROMORPHONE HCL 1 MG/ML IJ SOLN
1.0000 mg | INTRAMUSCULAR | Status: DC | PRN
Start: 2015-11-02 — End: 2015-11-03
  Administered 2015-11-02: 1 mg via INTRAVENOUS
  Administered 2015-11-02: 2 mg via INTRAVENOUS
  Administered 2015-11-02 (×3): 1 mg via INTRAVENOUS
  Filled 2015-11-02 (×2): qty 1
  Filled 2015-11-02 (×4): qty 2

## 2015-11-02 MED ORDER — LABETALOL HCL 5 MG/ML IV SOLN: 10.0000 mg | Freq: Four times a day (QID) | INTRAVENOUS | Status: DC | PRN

## 2015-11-02 MED ORDER — FENTANYL CITRATE (PF) 100 MCG/2ML IJ SOLN
INTRAMUSCULAR | Status: DC | PRN
  Administered 2015-11-02 (×3): 50 ug via INTRAVENOUS

## 2015-11-02 MED ORDER — POTASSIUM CHLORIDE IN NACL 20-0.45 MEQ/L-% IV SOLN
INTRAVENOUS | Status: DC
  Administered 2015-11-02 – 2015-11-03 (×4): via INTRAVENOUS
  Filled 2015-11-02 (×9): qty 1000

## 2015-11-02 MED ORDER — FENTANYL CITRATE (PF) 100 MCG/2ML IJ SOLN
25.0000 ug | INTRAMUSCULAR | Status: AC | PRN
  Administered 2015-11-02 (×6): 25 ug via INTRAVENOUS

## 2015-11-02 MED ORDER — DEXTROSE 5 % IV SOLN
3.0000 g | Freq: Once | INTRAVENOUS | Status: AC
  Administered 2015-11-02: 3 g via INTRAVENOUS
  Filled 2015-11-02: qty 3000

## 2015-11-02 MED ORDER — SUGAMMADEX SODIUM 200 MG/2ML IV SOLN
INTRAVENOUS | Status: DC | PRN
  Administered 2015-11-02: 468.2 mg via INTRAVENOUS

## 2015-11-02 MED ORDER — INSULIN ASPART 100 UNIT/ML ~~LOC~~ SOLN: 0.0000 [IU] | Freq: Three times a day (TID) | SUBCUTANEOUS | Status: DC

## 2015-11-02 MED ORDER — ONDANSETRON HCL 4 MG/2ML IJ SOLN
INTRAMUSCULAR | Status: DC | PRN
  Administered 2015-11-02: 4 mg via INTRAVENOUS

## 2015-11-02 MED ORDER — SUCCINYLCHOLINE CHLORIDE 20 MG/ML IJ SOLN
INTRAMUSCULAR | Status: DC | PRN
  Administered 2015-11-02: 160 mg via INTRAVENOUS

## 2015-11-02 MED ORDER — FAMOTIDINE IN NACL 20-0.9 MG/50ML-% IV SOLN
20.0000 mg | Freq: Two times a day (BID) | INTRAVENOUS | Status: DC
  Administered 2015-11-02 – 2015-11-03 (×2): 20 mg via INTRAVENOUS
  Filled 2015-11-02 (×5): qty 50

## 2015-11-02 MED ORDER — BUPIVACAINE-EPINEPHRINE (PF) 0.25% -1:200000 IJ SOLN
INTRAMUSCULAR | Status: DC | PRN
  Administered 2015-11-02: 60 mL via PERINEURAL

## 2015-11-02 MED ORDER — MIDAZOLAM HCL 2 MG/2ML IJ SOLN
INTRAMUSCULAR | Status: DC | PRN
  Administered 2015-11-02: 2 mg via INTRAVENOUS

## 2015-11-02 MED ORDER — SODIUM CHLORIDE FLUSH 0.9 % IV SOLN
INTRAVENOUS | Status: AC
  Filled 2015-11-02: qty 3

## 2015-11-02 MED ORDER — PROPOFOL 10 MG/ML IV BOLUS
INTRAVENOUS | Status: DC | PRN
  Administered 2015-11-02: 200 mg via INTRAVENOUS

## 2015-11-02 MED ORDER — ACETAMINOPHEN 10 MG/ML IV SOLN
INTRAVENOUS | Status: AC
  Filled 2015-11-02: qty 100

## 2015-11-02 MED ORDER — METOPROLOL TARTRATE 5 MG/5ML IV SOLN
INTRAVENOUS | Status: DC | PRN
  Administered 2015-11-02: 3 mg via INTRAVENOUS
  Administered 2015-11-02: 2 mg via INTRAVENOUS

## 2015-11-02 MED ORDER — OXYCODONE HCL 5 MG/5ML PO SOLN
5.0000 mg | ORAL | Status: DC | PRN
  Administered 2015-11-03: 10 mg via ORAL
  Administered 2015-11-03 (×2): 5 mg via ORAL
  Filled 2015-11-02: qty 5
  Filled 2015-11-02 (×2): qty 10

## 2015-11-02 MED ORDER — ACETAMINOPHEN 160 MG/5ML PO SOLN
650.0000 mg | ORAL | Status: DC | PRN
Start: 2015-11-03 — End: 2015-11-03

## 2015-11-02 MED ORDER — INSULIN ASPART 100 UNIT/ML ~~LOC~~ SOLN: 0.0000 [IU] | Freq: Every day | SUBCUTANEOUS | Status: DC

## 2015-11-02 SURGICAL SUPPLY — 55 items
APPLIER CLIP ROT 10 11.4 M/L (STAPLE)
BANDAGE ELASTIC 6 LF NS (GAUZE/BANDAGES/DRESSINGS) ×6 IMPLANT
BLADE SURG SZ11 CARB STEEL (BLADE) ×3 IMPLANT
CANISTER SUCT 1200ML W/VALVE (MISCELLANEOUS) ×3 IMPLANT
CATH TRAY 16F METER LATEX (MISCELLANEOUS) ×3 IMPLANT
CHLORAPREP W/TINT 26ML (MISCELLANEOUS) ×6 IMPLANT
CLIP APPLIE ROT 10 11.4 M/L (STAPLE) IMPLANT
DEFOGGER SCOPE WARMER CLEARIFY (MISCELLANEOUS) ×3 IMPLANT
DRAPE UTILITY 15X26 TOWEL STRL (DRAPES) ×9 IMPLANT
FILTER LAP SMOKE EVAC STRL (MISCELLANEOUS) ×3 IMPLANT
GLOVE BIO SURGEON STRL SZ7 (GLOVE) ×6 IMPLANT
GLOVE BIO SURGEON STRL SZ7.5 (GLOVE) ×24 IMPLANT
GLOVE BIOGEL PI IND STRL 8.5 (GLOVE) IMPLANT
GLOVE BIOGEL PI INDICATOR 8.5 (GLOVE)
GLOVE SURG SYN 8.0 (GLOVE) IMPLANT
GOWN STRL REUS W/ TWL LRG LVL3 (GOWN DISPOSABLE) ×4 IMPLANT
GOWN STRL REUS W/ TWL XL LVL3 (GOWN DISPOSABLE) ×2 IMPLANT
GOWN STRL REUS W/TWL LRG LVL3 (GOWN DISPOSABLE) ×8
GOWN STRL REUS W/TWL XL LVL3 (GOWN DISPOSABLE) ×4
GRASPER SUT TROCAR 14GX15 (MISCELLANEOUS) ×3 IMPLANT
IRRIGATION STRYKERFLOW (MISCELLANEOUS) ×1 IMPLANT
IRRIGATOR STRYKERFLOW (MISCELLANEOUS) ×3
IV NS 1000ML (IV SOLUTION) ×2
IV NS 1000ML BAXH (IV SOLUTION) ×1 IMPLANT
KIT RM TURNOVER STRD PROC AR (KITS) ×3 IMPLANT
LABEL OR SOLS (LABEL) ×3 IMPLANT
LIQUID BAND (GAUZE/BANDAGES/DRESSINGS) ×3 IMPLANT
NDL SAFETY 22GX1.5 (NEEDLE) ×3 IMPLANT
NS IRRIG 1000ML POUR BTL (IV SOLUTION) ×3 IMPLANT
PACK LAP CHOLECYSTECTOMY (MISCELLANEOUS) ×3 IMPLANT
RELOAD STAPLER BLUE 60MM (STAPLE) ×1 IMPLANT
RELOAD STAPLER GOLD 60MM (STAPLE) ×7 IMPLANT
RELOAD STAPLER GREEN 60MM (STAPLE) ×1 IMPLANT
RELOAD STAPLER WHITE 60MM (STAPLE) ×3 IMPLANT
SHEARS HARMONIC ACE PLUS 45CM (MISCELLANEOUS) ×3 IMPLANT
SLEEVE ENDOPATH XCEL 5M (ENDOMECHANICALS) ×9 IMPLANT
SLEEVE GASTRECTOMY 40FR VISIGI (MISCELLANEOUS) ×3 IMPLANT
STAPLER ECHELON LONG 60 440 (INSTRUMENTS) ×3 IMPLANT
STAPLER RELOAD BLUE 60MM (STAPLE) ×3
STAPLER RELOAD GOLD 60MM (STAPLE) ×21
STAPLER RELOAD GREEN 60MM (STAPLE) ×3
STAPLER RELOAD WHITE 60MM (STAPLE) ×9
SUT DEVICE BRAIDED 2.0X39 (SUTURE) ×21 IMPLANT
SUT DVC VICRYL PGA 2.0X39 (SUTURE) ×18 IMPLANT
SUT MNCRL AB 4-0 PS2 18 (SUTURE) ×12 IMPLANT
SUT VIC AB 0 UR5 27 (SUTURE) IMPLANT
SUT VIC AB 3-0 SH 27 (SUTURE)
SUT VIC AB 3-0 SH 27X BRD (SUTURE) IMPLANT
SYR 20CC LL (SYRINGE) IMPLANT
TROCAR 5M 150ML BLDLS (TROCAR) ×3 IMPLANT
TROCAR BLADELESS 15MM (ENDOMECHANICALS) IMPLANT
TROCAR XCEL 12X100 BLDLESS (ENDOMECHANICALS) ×3 IMPLANT
TROCAR XCEL NON-BLD 5MMX100MML (ENDOMECHANICALS) ×3 IMPLANT
TUBING INSUFFLATOR HEATED (MISCELLANEOUS) ×3 IMPLANT
WATER STERILE IRR 1000ML POUR (IV SOLUTION) ×3 IMPLANT

## 2015-11-02 NOTE — Anesthesia Postprocedure Evaluation (Signed)
Anesthesia Post Note  Patient: Orion ModestRyan L Tuller  Procedure(s) Performed: Procedure(s) (LRB): LAPAROSCOPIC GASTRIC RESTRICTIVE DUODENAL PROCEDURE (DUODENAL SWITCH) (N/A)  Patient location during evaluation: PACU Anesthesia Type: General Level of consciousness: awake and alert and oriented Pain management: pain level controlled Vital Signs Assessment: post-procedure vital signs reviewed and stable Respiratory status: spontaneous breathing Cardiovascular status: blood pressure returned to baseline Anesthetic complications: no    Last Vitals:  Vitals:   11/02/15 1300 11/02/15 1338  BP: (!) 109/44 (!) 111/47  Pulse: 68 67  Resp: 18 20  Temp: 36.4 C 36.6 C    Last Pain:  Vitals:   11/02/15 1347  TempSrc:   PainSc: 8                  Kire Ferg

## 2015-11-02 NOTE — Anesthesia Preprocedure Evaluation (Signed)
Anesthesia Evaluation  Patient identified by MRN, date of birth, ID band Patient awake    Reviewed: Allergy & Precautions, NPO status , Patient's Chart, lab work & pertinent test results  Airway Mallampati: II  TM Distance: >3 FB     Dental no notable dental hx.    Pulmonary sleep apnea and Continuous Positive Airway Pressure Ventilation , former smoker,    Pulmonary exam normal        Cardiovascular hypertension, Pt. on medications Normal cardiovascular exam     Neuro/Psych PSYCHIATRIC DISORDERS Depression negative neurological ROS     GI/Hepatic GERD  Medicated and Controlled,  Endo/Other  diabetes, Well Controlled, Type 2, Oral Hypoglycemic Agents  Renal/GU Renal InsufficiencyRenal disease  negative genitourinary   Musculoskeletal negative musculoskeletal ROS (+)   Abdominal Normal abdominal exam  (+)   Peds negative pediatric ROS (+)  Hematology negative hematology ROS (+)   Anesthesia Other Findings   Reproductive/Obstetrics                             Anesthesia Physical Anesthesia Plan  ASA: III  Anesthesia Plan: General   Post-op Pain Management:    Induction: Intravenous, Rapid sequence and Cricoid pressure planned  Airway Management Planned: Oral ETT  Additional Equipment:   Intra-op Plan:   Post-operative Plan: Extubation in OR  Informed Consent: I have reviewed the patients History and Physical, chart, labs and discussed the procedure including the risks, benefits and alternatives for the proposed anesthesia with the patient or authorized representative who has indicated his/her understanding and acceptance.   Dental advisory given  Plan Discussed with: CRNA and Surgeon  Anesthesia Plan Comments:         Anesthesia Quick Evaluation

## 2015-11-02 NOTE — Progress Notes (Signed)
Per Pt request called MD. Molli Knockkay per MD to remove foley catheter

## 2015-11-02 NOTE — Anesthesia Procedure Notes (Signed)
Procedure Name: Intubation Date/Time: 11/02/2015 7:57 AM Performed by: Almeta MonasFLETCHER, Solomiya Pascale Pre-anesthesia Checklist: Patient identified, Emergency Drugs available, Suction available and Patient being monitored Patient Re-evaluated:Patient Re-evaluated prior to inductionOxygen Delivery Method: Circle system utilized Preoxygenation: Pre-oxygenation with 100% oxygen Intubation Type: IV induction Ventilation: Mask ventilation without difficulty Laryngoscope Size: Mac and 4 Grade View: Grade I Tube type: Oral Tube size: 7.5 mm Number of attempts: 1 Airway Equipment and Method: Stylet and Patient positioned with wedge pillow (pt ramped for intubation) Placement Confirmation: ETT inserted through vocal cords under direct vision,  positive ETCO2,  CO2 detector and breath sounds checked- equal and bilateral Secured at: 22 cm Tube secured with: Tape Dental Injury: Teeth and Oropharynx as per pre-operative assessment

## 2015-11-02 NOTE — Op Note (Signed)
PATIENT: Walter Parks 1985-11-01  PROCEDURE PERFORMED: Procedure(s): LAPAROSCOPIC GASTRIC RESTRICTIVE DUODENAL PROCEDURE (DUODENAL SWITCH) (N/A) PRE-OP DIAGNOSIS: MORBID OBESITY POST-OP DIAGNOSIS: same ESTIMATED BLOOD LOSS: Minimal SURGEON: Everette Rankyner, Marabelle Cushman A  ASSISTANT: Rosette RevealMargaret Stowasser,PA PROCEDURE NOTE: The patient was brought to the operating room and placed in the supine position. General anesthesia was obtained with orotracheal intubation. Foley catheter inserted sterilely. TED hose and Thromboguards applied. A foot board applied at the enatd of the operative bed. The chest and abdomen were sterilely prepped and draped. A 5 mm Optiview trocar introduced under direct visualization in the left upper quadrant of the abdomen. Pneumoperitoneum obtained. Four additional trocars introduced across the upper abdomen. The cecum and distal ileum identified. The small bowel was then followed approximate 300 cm, at which point it was secured to the gastrocolic ligament. A Nathanson liver retractor was introduced followed by elevation of the left lobe of the liver. . The patient then had identification and marking of the pylorus. Beginning approximately 4 cm proximal to this there was division of vascular pedicles along the greater curvature of the stomach, this was extended cephalad over a distance of approximately 8 cm. Creation of a gastric sleeve effect was then initiated. The patient had a series of GI staple firings placed creating a medially based gastric tube effect. The first firing was a black load stapler placed in a relative transverse direction as was the next gold load firing. These were positioned in a way to avoid narrowing in the region of the incisura. Next a more vertical line of staples was placed parallel to the lesser curvature and ultimately brought out just lateral to the angle of His. As this was done the 7840 [place] ViSiGi device which was placed in the antral region was gradually withdrawn to  helped create a consistent tubular effect. The lateral stomach then freed from the gastrocolic and gastrosplenic ligament and removed via the right upper quadrant 12 mm trocar site. The residual vascular pedicles along the lower inner curve of the duodenum bulb were divided by use of the Harmonic scalpel, the peritoneum being scored immediately before this and also the lateral peritoneum also scored. Blunt dissection behind the duodenum divided by use of the Harmonic scalpel. This was ultimately brought out lateral to the duodenum, approximately 3 cm inferior to the pylorus. Because of small bowel bleeding that was difficult to expose it was decided to complete mobilization of the inner curve of the lower stomach and most proximal aspect of the duodenum. This was again done with use of the Harmonic scalpel, a small bleeding point controlled by application of a 5 mm clip applier. The patient then had a blue load stapler introduced across the duodenum again approximately 3 cm inferior to the pylorus. This was used to divide the duodenum with excellent hemostasis. Several residual vascular pedicles on the lateral aspect of the duodenum divided, allowing this to be brought more to the area of the midline. The patient then had an ileal-duodenal anastomosis constructed. This was done with securing seromuscular layers of the distal duodenal staple line area and the antimesenteric border of the ileum. Enterotomies then made on the anterior aspect of the duodenum and opposing portion of the ileum.   A full-thickness running 3-0 Polysorb suture used to complete the anastomosis, the suture line then reinforced anteriorly with an additional running 2-0 Polysorb suture. The intestine was occluded distally and insufflation of the gastric tube in area of anastomosis performed. A saline bath performed, no air leak identified.  At this point the ViSiGi device was withdrawn.  The jejunum immediate proximal to the duodenal anastomosis  was divided with white load gia stapler along with partial division of mesentery with harmonic scalpel. The alimentary limb was followed distally 50 cm at which point a jejunal jejunal anastomosis created between the new biliary limb and the common channel. This was created by an enterotomy on the antimesenteric margin of each portions of bowel followed by a white load staple firing at 35 mm to creat a common lumen. The resulting enterotomy was closed with a white load staple. Antitorsion sutures placed distal to the anastomosis and the mesenteric window closed with 2.0 surgidec suture.A portion of the divided gastrocolic ligament was secured to the lateral margins of the gastric sleeve to assure a consistent, somewhat rounded entry into the distal stomach. At this point the fascia and peritoneum of the 12 mm trocar site was closed with 0 Vicryl suture as passed by a needle suture passing system under direct visualization. The pneumoperitoneum relieved. The trocars removed. The wounds injected with 0.25% Marcaine and closed with 4-0 Monocryl in the dermis followed by Dermabond.

## 2015-11-02 NOTE — H&P (Signed)
History and physical on paper chart, no changes noted 

## 2015-11-02 NOTE — Transfer of Care (Signed)
Immediate Anesthesia Transfer of Care Note  Patient: Walter Parks  Procedure(s) Performed: Procedure(s): LAPAROSCOPIC GASTRIC RESTRICTIVE DUODENAL PROCEDURE (DUODENAL SWITCH) (N/A)  Patient Location: PACU  Anesthesia Type:General  Level of Consciousness: sedated  Airway & Oxygen Therapy: Patient Spontanous Breathing and Patient connected to face mask oxygen  Post-op Assessment: Report given to RN and Post -op Vital signs reviewed and stable  Post vital signs: Reviewed and stable  Last Vitals:  Vitals:   11/02/15 0701 11/02/15 1125  BP: (!) 159/79 135/68  Pulse: (!) 103 90  Resp: 20 (!) 25  Temp: (!) 35.7 C 37.3 C    Last Pain:  Vitals:   11/02/15 0701  TempSrc: Tympanic         Complications: No apparent anesthesia complications

## 2015-11-02 NOTE — Progress Notes (Signed)
Okay to leave foley in until in the morning per Dr. Alva Garnetyner

## 2015-11-02 NOTE — Consult Note (Signed)
Patient Demographics  Walter BimlerRyan Parks, is a 30 y.o. male   MRN: [id number]   DOB - [birth year]  Admit Date - 11/02/2015    Outpatient Primary MD for the patient is Walter GoutyMelinda Lada, MD  Consult requested in the Hospital by Walter RankMichael A Tyner, MD, On 11/02/2015    Reason for consult;  Management  of hypertension, diabetes mellitus type 2. HPI; Walter BimlerRyan Parks  is a 30 y.o. male, history of diabetes mellitus type 2, but obesity, essential hypertension had a gastric bypass today and we are asked to see the patient for management of hypertension, diabetes mellitus type 2. Patient is a having postoperative  abdominal pain but denies any other complaints.    With History of -  Past Medical History:  Diagnosis Date  . Depression   . Diabetes mellitus without complication (HCC)   . GERD (gastroesophageal reflux disease)   . Hx of tobacco use, presenting hazards to health 06/25/2015  . Hypertension   . Obesity   . Sleep apnea    on an APAP machine      Past Surgical History:  Procedure Laterality Date  . TONSILLECTOMY    . TONSILLECTOMY        Review of Systems    In addition to the HPI above, No Fever-chills, No Headache, No changes with Vision or hearing, No problems swallowing food or Liquids, No Chest pain, Cough or Shortness of Breath, Post operative abdominal pain. No Blood in stool or Urine, No dysuria, No new skin rashes or bruises, No new joints pains-aches,  No new weakness, tingling, numbness in any extremity, No recent weight gain or loss, No polyuria, polydypsia or polyphagia, No significant Mental Stressors.  A full 10 point Review of Systems was done, except as stated above, all other Review of Systems were negative.   Social History Social History  Substance Use Topics  . Smoking status: Current Some Day  Smoker    Packs/day: 0.00    Years: 18.00    Types: Cigarettes    Last attempt to quit: 08/01/2015  . Smokeless tobacco: Never Used  . Alcohol use 0.0 oz/week     Comment: socially     Family History Family History  Problem Relation Age of Onset  . Hypertension Mother   . Hypertension Father   . Diabetes Maternal Aunt   . Hypertension Maternal Grandmother   . Hypertension Maternal Grandfather   . Stroke Paternal Grandmother   . Hypertension Paternal Grandmother   . Hypertension Paternal Grandfather   . Diabetes Cousin   . Heart disease Neg Hx   . COPD Neg Hx      Prior to Admission medications   Medication Sig Start Date End Date Taking? Authorizing Provider  amLODipine (NORVASC) 10 MG tablet Take 1 tablet (10 mg total) by mouth daily. Patient taking differently: Take 10 mg by mouth daily after lunch.  06/25/15  Yes Walter BatheMark C Skains, MD  CHANTIX STARTING MONTH PAK 0.5 MG X 11 & 1 MG X  42 tablet Take 0.5 mg by mouth 2 (two) times daily. see package 05/27/15  Yes Historical Provider, MD  cholecalciferol (VITAMIN D) 1000 units tablet Take 1,000 Units by mouth daily.   Yes Historical Provider, MD  IRON PO Take 1 tablet by mouth daily.   Yes Historical Provider, MD  lisinopril (PRINIVIL,ZESTRIL) 10 MG tablet TAKE 1 TABLET (10 MG TOTAL) BY MOUTH DAILY. Patient taking differently: TAKE 1 TABLET (10 MG TOTAL) BY MOUTH DAILY.-2 PM 08/12/15  Yes Walter PasseyMelinda P Lada, MD  metFORMIN (GLUCOPHAGE-XR) 500 MG 24 hr tablet Take 2 tablets (1,000 mg total) by mouth every evening. 07/21/15  Yes Walter PasseyMelinda P Lada, MD  VITAMIN A PO Take 1 tablet by mouth daily.   Yes Historical Provider, MD  vitamin B-12 (CYANOCOBALAMIN) 1000 MCG tablet Take 1,000 mcg by mouth daily.   Yes Historical Provider, MD    Anti-infectives    Start     Dose/Rate Route Frequency Ordered Stop   11/02/15 0245  ceFAZolin (ANCEF) 3 g in dextrose 5 % 50 mL IVPB     3 g 130 mL/hr over 30 Minutes Intravenous  Once 11/02/15 0239 11/02/15 0810       Scheduled Meds: . [START ON 11/03/2015] amLODipine  10 mg Oral Daily  . [START ON 11/03/2015] enoxaparin (LOVENOX) injection  40 mg Subcutaneous Q12H  . famotidine (PEPCID) IV  20 mg Intravenous Q12H  . insulin aspart  0-20 Units Subcutaneous TID WC  . insulin aspart  0-5 Units Subcutaneous QHS  . lisinopril  10 mg Oral Daily  . scopolamine  1 patch Transdermal Once  . scopolamine      . sodium chloride flush       Continuous Infusions: . 0.45 % NaCl with KCl 20 mEq / L 150 mL/hr at 11/02/15 1317   PRN Meds:.[START ON 11/03/2015] oxyCODONE **AND** [START ON 11/03/2015] acetaminophen, [START ON 11/03/2015] acetaminophen (TYLENOL) oral liquid 160 mg/5 mL, enalaprilat, HYDROmorphone (DILAUDID) injection, labetalol, ondansetron (ZOFRAN) IV, promethazine  Allergies  Allergen Reactions  . Lemon Flavor Hives    Physical Exam  Vitals  Blood pressure (!) 111/47, pulse 67, temperature 97.9 F (36.6 C), temperature source Oral, resp. rate 20, height 6\' 2"  (1.88 m), weight (!) 234.1 kg (516 lb), SpO2 98 %.   1. General At, awake lying in bed in NAD, morbidly obese male not in distress.  2. Normal affect and insight, Not Suicidal or Homicidal, Awake Alert, Oriented X 3.  3. No F.N deficits, ALL C.Nerves Intact, Strength 5/5 all 4 extremities, Sensation intact all 4 extremities, Plantars down going.  4. Ears and Eyes appear Normal, Conjunctivae clear, PERRLA. Moist Oral Mucosa.  5. Supple Neck, No JVD, No cervical lymphadenopathy appriciated, No Carotid Bruits.  6. Symmetrical Chest wall movement, Good air movement bilaterally, CTAB.  7. RRR, No Gallops, Rubs or Murmurs, No Parasternal Heave.  8. Positive Bowel Sounds, abdomen slightly distended, laparoscopic incisions are seen. 9.  No Cyanosis, Normal Skin Turgor, No Skin Rash or Bruise.  10. Good muscle tone,  joints appear normal , no effusions, Normal ROM.  11. No Palpable Lymph Nodes in Neck or Axillae  Data  Review  CBC No results for input(s): WBC, HGB, HCT, PLT, MCV, MCH, MCHC, RDW, LYMPHSABS, MONOABS, EOSABS, BASOSABS, BANDABS in the last 168 hours.  Invalid input(s): NEUTRABS, BANDSABD ------------------------------------------------------------------------------------------------------------------  Chemistries   Recent Labs Lab 10/29/15 1220  NA 136  K 4.1  CL 101  CO2 27  GLUCOSE 91  BUN 15  CREATININE 0.86  CALCIUM 9.5   ------------------------------------------------------------------------------------------------------------------ estimated creatinine clearance is 256.3 mL/min (by C-G formula based on SCr of 0.86 mg/dL). ------------------------------------------------------------------------------------------------------------------ No results for input(s): TSH, T4TOTAL, T3FREE, THYROIDAB in the last 72 hours.  Invalid input(s): FREET3   Coagulation profile No results for input(s): INR, PROTIME in the last 168 hours. ------------------------------------------------------------------------------------------------------------------- No results for input(s): DDIMER in the last 72 hours. -------------------------------------------------------------------------------------------------------------------  Cardiac Enzymes No results for input(s): CKMB, TROPONINI, MYOGLOBIN in the last 168 hours.  Invalid input(s): CK ------------------------------------------------------------------------------------------------------------------ Invalid input(s): POCBNP   ---------------------------------------------------------------------------------------------------------------  Urinalysis No results found for: COLORURINE, APPEARANCEUR, LABSPEC, PHURINE, GLUCOSEU, HGBUR, BILIRUBINUR, KETONESUR, PROTEINUR, UROBILINOGEN, NITRITE, LEUKOCYTESUR   Imaging results:   No results found.    Assessment & Plan  Active Problems:   Obesity    1. Morbid obesity status post  laparoscopic gastric reconstruction with  duodenal switch   #2. Hypertension: Controlled, recommend continuing the Norvasc 10 mg by mouth daily, enalapril  10 mg by mouth daily. #3 diabetes mellitus type 2: Patient had surgery today on clear liquids now continue sliding-scale insulin coverage., Resume metformin at home if he continues to do well with by mouth intake. Number for pain control with IV Dilaudid, GI prophylaxis with PPIs, IV hydration with half normal saline with 20 KCl at 150 mL hours. When rechecking the potassium tomorrow.   DVT Prophylaxis Heparin -  Lovenox - SCDs   AM Labs Ordered, also please review Full Orders  Family Communication: Plan discussed with patient andmother  Time spent ;55 min Thank you for the consult, we will follow the patient with you in the Hospital.   [facility] M.D on 11/02/2015 at 5:02 PM  Note: This dictation was prepared with Dragon dictation along with smaller phrase technology. Any transcriptional errors that result from this process are unintentional.

## 2015-11-03 ENCOUNTER — Encounter: Payer: Self-pay | Admitting: Bariatrics

## 2015-11-03 DIAGNOSIS — Z6841 Body Mass Index (BMI) 40.0 and over, adult: Secondary | ICD-10-CM | POA: Diagnosis not present

## 2015-11-03 LAB — COMPREHENSIVE METABOLIC PANEL
ALBUMIN: 3.7 g/dL (ref 3.5–5.0)
ALT: 238 U/L — AB (ref 17–63)
AST: 254 U/L — AB (ref 15–41)
Alkaline Phosphatase: 80 U/L (ref 38–126)
Anion gap: 9 (ref 5–15)
BUN: 8 mg/dL (ref 6–20)
CHLORIDE: 101 mmol/L (ref 101–111)
CO2: 24 mmol/L (ref 22–32)
CREATININE: 0.88 mg/dL (ref 0.61–1.24)
Calcium: 8.8 mg/dL — ABNORMAL LOW (ref 8.9–10.3)
GFR calc Af Amer: >60 mL/min (ref >60)
GFR calc non Af Amer: >60 mL/min (ref >60)
Glucose, Bld: 102 mg/dL — ABNORMAL HIGH (ref 65–99)
Potassium: 4.1 mmol/L (ref 3.5–5.1)
SODIUM: 134 mmol/L — AB (ref 135–145)
Total Bilirubin: 1 mg/dL (ref 0.3–1.2)
Total Protein: 7.8 g/dL (ref 6.5–8.1)

## 2015-11-03 LAB — GLUCOSE, CAPILLARY
GLUCOSE-CAPILLARY: 112 mg/dL — AB (ref 65–99)
Glucose-Capillary: 108 mg/dL — ABNORMAL HIGH (ref 65–99)
Glucose-Capillary: 87 mg/dL (ref 65–99)

## 2015-11-03 LAB — CBC WITH DIFFERENTIAL/PLATELET
BASOS ABS: 0 10*3/uL (ref 0–0.1)
BASOS PCT: 0 %
EOS ABS: 0 10*3/uL (ref 0–0.7)
EOS PCT: 0 %
HCT: 47.1 % (ref 40.0–52.0)
Hemoglobin: 15.1 g/dL (ref 13.0–18.0)
LYMPHS PCT: 14 %
Lymphs Abs: 2.1 10*3/uL (ref 1.0–3.6)
MCH: 25.5 pg — ABNORMAL LOW (ref 26.0–34.0)
MCHC: 32 g/dL (ref 32.0–36.0)
MCV: 79.5 fL — ABNORMAL LOW (ref 80.0–100.0)
Monocytes Absolute: 0.9 10*3/uL (ref 0.2–1.0)
Monocytes Relative: 6 %
Neutro Abs: 11.9 10*3/uL — ABNORMAL HIGH (ref 1.4–6.5)
Neutrophils Relative %: 80 %
PLATELETS: 303 10*3/uL (ref 150–440)
RBC: 5.93 MIL/uL — AB (ref 4.40–5.90)
RDW: 17 % — ABNORMAL HIGH (ref 11.5–14.5)
WBC: 14.8 10*3/uL — AB (ref 3.8–10.6)

## 2015-11-03 LAB — SURGICAL PATHOLOGY

## 2015-11-03 NOTE — Progress Notes (Signed)
[facility]   PATIENT NAME: Walter BimlerRyan Parks    MR#:  [id number]  DATE OF BIRTH:  1985-10-09  SUBJECTIVE:   Pt. Is s/p Duodenal switch.  Complaining of abdominal pain. No N/V. Tolerating full liquid diet well.   REVIEW OF SYSTEMS:    Review of Systems  Constitutional: Negative for chills and fever.  HENT: Negative for congestion and tinnitus.   Eyes: Negative for blurred vision and double vision.  Respiratory: Negative for cough, shortness of breath and wheezing.   Cardiovascular: Negative for chest pain, orthopnea and PND.  Gastrointestinal: Positive for abdominal pain. Negative for diarrhea, nausea and vomiting.  Genitourinary: Negative for dysuria and hematuria.  Neurological: Negative for dizziness, sensory change and focal weakness.  All other systems reviewed and are negative.   Nutrition: Full liquids Tolerating Diet: Yes Tolerating PT: Ambulatory  DRUG ALLERGIES:   Allergies  Allergen Reactions  . Lemon Flavor Hives    VITALS:  Blood pressure (!) 146/82, pulse 74, temperature 98.2 F (36.8 C), temperature source Oral, resp. rate 19, height 6\' 2"  (1.88 m), weight (!) 234.1 kg (516 lb), SpO2 100 %.  PHYSICAL EXAMINATION:   Physical Exam  GENERAL:  30 y.o.-year-old obese patient lying in the bed in no acute distress.  EYES: Pupils equal, round, reactive to light and accommodation. No scleral icterus. Extraocular muscles intact.  HEENT: Head atraumatic, normocephalic. Oropharynx and nasopharynx clear.  NECK:  Supple, no jugular venous distention. No thyroid enlargement, no tenderness.  LUNGS: Normal breath sounds bilaterally, no wheezing, rales, rhonchi. No use of accessory muscles of respiration.  CARDIOVASCULAR: S1, S2 normal. No murmurs, rubs, or gallops.  ABDOMEN: Soft, nontender, nondistended. Bowel sounds present. No organomegaly or mass.  EXTREMITIES: No cyanosis, clubbing or edema b/l.    NEUROLOGIC: Cranial nerves II  through XII are intact. No focal Motor or sensory deficits b/l.   PSYCHIATRIC: The patient is alert and oriented x 3.  SKIN: No obvious rash, lesion, or ulcer.    LABORATORY PANEL:   CBC  Recent Labs Lab 11/03/15 0503  WBC 14.8*  HGB 15.1  HCT 47.1  PLT 303   ------------------------------------------------------------------------------------------------------------------  Chemistries   Recent Labs Lab 11/03/15 0503  NA 134*  K 4.1  CL 101  CO2 24  GLUCOSE 102*  BUN 8  CREATININE 0.88  CALCIUM 8.8*  AST 254*  ALT 238*  ALKPHOS 80  BILITOT 1.0   ------------------------------------------------------------------------------------------------------------------  Cardiac Enzymes No results for input(s): TROPONINI in the last 168 hours. ------------------------------------------------------------------------------------------------------------------  RADIOLOGY:  No results found.   ASSESSMENT AND PLAN:   30 year old male with past medical history of morbid obesity, diabetes, essential hypertension who presented to the hospital for bariatric surgery with a duodenal switch.  1. Status post lap gastric duodenal switch-continue pain control and care as Bariatric surgery.  - cont. Bariatric full liquid diet.   2. DM - cont. SSI and follow sugars.  - resume Metformin upon discharge.   3. HTN - cont. Norvasc, Lisinopril.  - cont. PRN Vasotec.   4. OSA - cont. CPAP  5. Abnormal LFT's - etiology unclear. Had an abdominal US in March'17 which was normal.  - follow LFT's as outpatient.    All the records are reviewed and case discussed with Care Management/Social Worker. Management plans discussed with the patient, family and they are in agreement.  CODE STATUS: Full   DVT Prophylaxis: Lovenox  TOTAL TIME TAKING CARE OF THIS PATIENT: 30 minutes.  POSSIBLE D/C IN 1-2 DAYS, DEPENDING ON CLINICAL CONDITION.   Houston SirenSAINANI,VIVEK J M.D on 11/03/2015 at 4:08  PM  Between 7am to 6pm - Pager - [phone]  After 6pm go to [email or web address] - Social research officer, governmentpassword [email or web address] [email or web address]  [facility] [place] [facility]  Office  [phone]  CC: Primary care physician; Baruch GoutyMelinda Lada, MD

## 2015-11-03 NOTE — Progress Notes (Signed)
INTERVENTION:  RD consulted for nutrition education regarding inpatient bariatric surgery.   RD provided "The Liquid Diet" handout from the Bariatric Surgery Guide from the [facility]. This handout previously provided to patient prior to surgery is a duplicate copy. Discussed what foods/liquids are consistent with a Clear Liquid Diet and reinforced Key Concepts such as no carbonation, no caffeine, or sugar containing beverages. Provided methods to prevent dehydration and promote protein intake, using clock and sample fluid schedule. RD encouraged follow-up with outpatient dietitian after discharge.  Teach back method used.  Expect good compliance.  NUTRITION DIAGNOSIS:  Food and nutrition knowledge related deficit related to recent bariatric surgery as evidenced by dietitian consult for nutrition education   GOAL:  Patient will be able to sip and tolerate CL within 24-48 hours  MONITOR:  Energy intake Digestive system  ASSESSMENT:  Pt s/p duodenal switch procedure.    Past Medical History:  Diagnosis Date  . Depression   . Diabetes mellitus without complication (HCC)   . GERD (gastroesophageal reflux disease)   . Hx of tobacco use, presenting hazards to health 06/25/2015  . Hypertension   . Obesity   . Sleep apnea    on an APAP machine     Body mass index is 66.25 kg/m. Pt meets criteria for morbid obesity based on current BMI.  Current diet order is bariatric clear liquids, patient is consuming approximately 1 oz at this time.   Labs and medications reviewed.   LOW Care Level  Asusena Sigley B. Freida BusmanAllen, RD, LDN [phone] (pager) Weekend/On-Call pager [phone])

## 2015-11-03 NOTE — Progress Notes (Signed)
Pt A and O x 4. VSS. Pt tolerating diet well. No complaints of pain or nausea. IV removed intact. Pt voiced understanding of discharge instructions with no further questions. Pt discharged via wheelchair with nurse aide.

## 2015-11-03 NOTE — Discharge Summary (Signed)
Physician Discharge Summary   Patient ID: Walter Parks [id number] 30 y.o. 1985-10-23  Admit date: 11/02/2015  Discharge date and time: No discharge date for patient encounter.   Admitting Physician: Everette RankMichael A Tyner, MD   Discharge Physician: Effie ShyMichael Tyner MD  Admission Diagnoses: MORBID OBESITY  Discharge Diagnoses: morbid obesity   Admission Condition: good  Discharged Condition: good  Indication for Admission: Surgery   Hospital Course: Walter Parks was admitted for Procedure(s) (LRB): LAPAROSCOPIC GASTRIC RESTRICTIVE DUODENAL PROCEDURE (DUODENAL SWITCH) (N/A) on 11/02/2015.  he tolerated the procedure well and was transferred to the PACU and ultimately up to the floor after he was stable.  On the floor on the night of surgery Walter Parks was ambulating well with vital signs remaining stable.  Pain was well controlled.  On postop day 1 he continued to well, there was little nausea through the night, vital signs remained stable and there were no complaints of any unusual pain.   He was started on bariatric clear liquids and his oral liquid pain medication was started.  He dud not  have any excess nausea and did not have additional hospital stays.    Eventually he tolerated his PO intake well, vital signs remained normal, foley was D/C'ed, he was ambulating well and he was educated on how to take care of themself at home, so he was ready to be discharged home  Consults: Hospitalist   Significant Diagnostic Studies: none  Treatments: IV hydration, analgesia: Dilaudid, anticoagulation: Lovenox and surgery: Laparoscopic DS  Discharge Exam: Abdomen: soft, N/D, appropriately tender, incisions C/D/I   Disposition: 01-Home or Self Care  Patient Instructions:    Medication List    STOP taking these medications   CHANTIX STARTING MONTH PAK 0.5 MG X 11 & 1 MG X 42 tablet Generic drug:  varenicline   cholecalciferol 1000 units tablet Commonly known as:  VITAMIN D   IRON PO    VITAMIN A PO   vitamin B-12 1000 MCG tablet Commonly known as:  CYANOCOBALAMIN     TAKE these medications   amLODipine 10 MG tablet Commonly known as:  NORVASC Take 1 tablet (10 mg total) by mouth daily. What changed:  when to take this   lisinopril 10 MG tablet Commonly known as:  PRINIVIL,ZESTRIL TAKE 1 TABLET (10 MG TOTAL) BY MOUTH DAILY. What changed:  See the new instructions.   metFORMIN 500 MG 24 hr tablet Commonly known as:  GLUCOPHAGE-XR Take 2 tablets (1,000 mg total) by mouth every evening.      Activity: activity as tolerated, ambulate in house, no lifting, driving, or strenuous exercise for 2 and no driving while on analgesics Diet: clear liquids, advance as tolerated Wound Care: keep wound clean and dry  Follow-up with Dr Alva Garnetyner in 2 weeks. Follow-up with Primary care in 2-4 days to discuss blood pressure medication titration   Signed: Benancio DeedsMargaret Macguire Holsinger 11/03/2015 4:07 PM

## 2015-11-29 ENCOUNTER — Ambulatory Visit (INDEPENDENT_AMBULATORY_CARE_PROVIDER_SITE_OTHER): Payer: Managed Care, Other (non HMO) | Admitting: Family Medicine

## 2015-11-29 ENCOUNTER — Encounter: Payer: Self-pay | Admitting: Family Medicine

## 2015-11-29 DIAGNOSIS — R740 Nonspecific elevation of levels of transaminase and lactic acid dehydrogenase [LDH]: Secondary | ICD-10-CM | POA: Diagnosis not present

## 2015-11-29 DIAGNOSIS — I10 Essential (primary) hypertension: Secondary | ICD-10-CM | POA: Diagnosis not present

## 2015-11-29 DIAGNOSIS — E119 Type 2 diabetes mellitus without complications: Secondary | ICD-10-CM

## 2015-11-29 DIAGNOSIS — R7401 Elevation of levels of liver transaminase levels: Secondary | ICD-10-CM | POA: Insufficient documentation

## 2015-11-29 LAB — COMPLETE METABOLIC PANEL WITH GFR
ALBUMIN: 4.2 g/dL (ref 3.6–5.1)
ALK PHOS: 86 U/L (ref 40–115)
ALT: 38 U/L (ref 9–46)
AST: 30 U/L (ref 10–40)
BILIRUBIN TOTAL: 0.6 mg/dL (ref 0.2–1.2)
BUN: 6 mg/dL — AB (ref 7–25)
CO2: 25 mmol/L (ref 20–31)
Calcium: 10 mg/dL (ref 8.6–10.3)
Chloride: 103 mmol/L (ref 98–110)
Creat: 0.92 mg/dL (ref 0.60–1.35)
GFR, Est African American: >89 mL/min (ref >=60)
GLUCOSE: 96 mg/dL (ref 65–99)
Potassium: 4 mmol/L (ref 3.5–5.3)
SODIUM: 139 mmol/L (ref 135–146)
TOTAL PROTEIN: 7.5 g/dL (ref 6.1–8.1)

## 2015-11-29 LAB — LIPID PANEL
Cholesterol: 130 mg/dL (ref 125–200)
HDL: 26 mg/dL — AB (ref >=40)
LDL CALC: 82 mg/dL (ref <130)
Total CHOL/HDL Ratio: 5 Ratio (ref <=5.0)
Triglycerides: 110 mg/dL (ref <150)
VLDL: 22 mg/dL (ref <30)

## 2015-11-29 LAB — HEMOGLOBIN A1C
HEMOGLOBIN A1C: 5.7 % — AB (ref <5.7)
MEAN PLASMA GLUCOSE: 117 mg/dL

## 2015-11-29 MED ORDER — METFORMIN HCL 500 MG PO TABS: 500.0000 mg | ORAL_TABLET | Freq: Two times a day (BID) | ORAL | 3 refills | Status: DC

## 2015-11-29 NOTE — Assessment & Plan Note (Signed)
Check LFTs 

## 2015-11-29 NOTE — Progress Notes (Signed)
BP 138/74   Pulse 83   Temp 98.5 F (36.9 C) (Oral)   Wt (!) 472 lb 3.2 oz (214.2 kg)   SpO2 96%   BMI 60.63 kg/m    Subjective:    Patient ID: Walter Parks, male    DOB: [birth year], 30 y.o.   MRN: [id number]  HPI: Walter Parks is a 30 y.o. male  Chief Complaint  Patient presents with  . Medication Refill     Highest weight was 568 pounds; 545 at the bariatric surgery center; 472 pounds No fevers, no blood clots Goes back to see the bariatric surgeon; did two weeks post-op; saw nutritionist, getting soft foods back into diet; regular f/u planned  BP today fair control, still on CCB and ACE-I  Type 2 diabetes mellitus; glucose well-controlled in the hospital; having some dry mouth, getting better, just the protein dries his mouth; last A1c was 7.1 in Jan  Last cholesterol LDL 114, total was 171; no meds  Liver enzymes up in the hospita;, but no jaundice, no abd pain; nausea fluctuates  Depression screen [facility] 2/9 11/29/2015 08/02/2015  Decreased Interest 0 0  Down, Depressed, Hopeless 0 0  PHQ - 2 Score 0 0    No flowsheet data found.  Relevant past medical, surgical, family and social history reviewed Past Medical History:  Diagnosis Date  . Depression   . Diabetes mellitus without complication (HCC)   . GERD (gastroesophageal reflux disease)   . Hx of tobacco use, presenting hazards to health 06/25/2015  . Hypertension   . Obesity   . Sleep apnea    on an APAP machine   Past Surgical History:  Procedure Laterality Date  . LAPAROSCOPIC GASTRIC RESTRICTIVE DUODENAL PROCEDURE (DUODENAL SWITCH) N/A 11/02/2015   Procedure: LAPAROSCOPIC GASTRIC RESTRICTIVE DUODENAL PROCEDURE (DUODENAL SWITCH);  Surgeon: Everette Rank, MD;  Location: [facility] ORS;  Service: General;  Laterality: N/A;  . TONSILLECTOMY    . TONSILLECTOMY     Family History  Problem Relation Age of Onset  . Hypertension Mother   . Hypertension Father   . Diabetes Maternal Aunt   . Hypertension Maternal  Grandmother   . Hypertension Maternal Grandfather   . Stroke Paternal Grandmother   . Hypertension Paternal Grandmother   . Hypertension Paternal Grandfather   . Diabetes Cousin   . Heart disease Neg Hx   . COPD Neg Hx    Social History  Substance Use Topics  . Smoking status: Current Some Day Smoker    Packs/day: 0.00    Years: 18.00    Types: Cigarettes    Last attempt to quit: 08/01/2015  . Smokeless tobacco: Never Used  . Alcohol use 0.0 oz/week     Comment: socially    Interim medical history since last visit reviewed. Allergies and medications reviewed  Review of Systems Per HPI unless specifically indicated above     Objective:    BP 138/74   Pulse 83   Temp 98.5 F (36.9 C) (Oral)   Wt (!) 472 lb 3.2 oz (214.2 kg)   SpO2 96%   BMI 60.63 kg/m   Wt Readings from Last 3 Encounters:  11/29/15 (!) 472 lb 3.2 oz (214.2 kg)  11/02/15 (!) 516 lb (234.1 kg)  08/02/15 (!) 518 lb (235 kg)    Physical Exam  Constitutional: He appears well-developed and well-nourished.  Morbidly obese; weight loss acknowledged  Eyes: EOM are normal. No scleral icterus.  Neck: No JVD present.  Cardiovascular:  Regular rhythm.   Heart sounds are distant due to morbid obesity  Pulmonary/Chest: Effort normal. He has decreased breath sounds.  Decreased breath sounds throughout secondary to morbid obesity  Musculoskeletal: He exhibits no edema.       Right foot: There is deformity (pes planus).       Left foot: There is deformity (pes planus).  Neurological: He is alert.  Skin: No pallor.  Psychiatric: He has a normal mood and affect.   Diabetic Foot Form - Detailed   Diabetic Foot Exam - detailed Diabetic Foot exam was performed with the following findings:  Yes 11/29/2015 10:04 AM  Visual Foot Exam completed.:  Yes  Are the toenails long?:  Yes Are the toenails thick?:  No Are the toenails ingrown?:  No Normal Range of Motion:  Yes Pulse Foot Exam completed.:  Yes  Right  Dorsalis Pedis:  Present Left Dorsalis Pedis:  Present  Sensory Foot Exam Completed.:  Yes Swelling:  No Semmes-Weinstein Monofilament Test R Site 1-Great Toe:  Pos L Site 1-Great Toe:  Pos  R Site 4:  Pos L Site 4:  Pos  R Site 5:  Pos L Site 5:  Pos       Results for orders placed or performed during the hospital encounter of 11/02/15  Glucose, capillary  Result Value Ref Range   Glucose-Capillary 95 65 - 99 mg/dL  Glucose, capillary  Result Value Ref Range   Glucose-Capillary 165 (H) 65 - 99 mg/dL  Glucose, capillary  Result Value Ref Range   Glucose-Capillary 107 (H) 65 - 99 mg/dL   Comment 1 Notify RN   CBC WITH DIFFERENTIAL  Result Value Ref Range   WBC 14.8 (H) 3.8 - 10.6 K/uL   RBC 5.93 (H) 4.40 - 5.90 MIL/uL   Hemoglobin 15.1 13.0 - 18.0 g/dL   HCT 40.9 81.1 - 91.4 %   MCV 79.5 (L) 80.0 - 100.0 fL   MCH 25.5 (L) 26.0 - 34.0 pg   MCHC 32.0 32.0 - 36.0 g/dL   RDW 78.2 (H) 95.6 - 21.3 %   Platelets 303 150 - 440 K/uL   Neutrophils Relative % 80 %   Neutro Abs 11.9 (H) 1.4 - 6.5 K/uL   Lymphocytes Relative 14 %   Lymphs Abs 2.1 1.0 - 3.6 K/uL   Monocytes Relative 6 %   Monocytes Absolute 0.9 0.2 - 1.0 K/uL   Eosinophils Relative 0 %   Eosinophils Absolute 0.0 0 - 0.7 K/uL   Basophils Relative 0 %   Basophils Absolute 0.0 0 - 0.1 K/uL  Comprehensive metabolic panel  Result Value Ref Range   Sodium 134 (L) 135 - 145 mmol/L   Potassium 4.1 3.5 - 5.1 mmol/L   Chloride 101 101 - 111 mmol/L   CO2 24 22 - 32 mmol/L   Glucose, Bld 102 (H) 65 - 99 mg/dL   BUN 8 6 - 20 mg/dL   Creatinine, Ser 0.86 0.61 - 1.24 mg/dL   Calcium 8.8 (L) 8.9 - 10.3 mg/dL   Total Protein 7.8 6.5 - 8.1 g/dL   Albumin 3.7 3.5 - 5.0 g/dL   AST 578 (H) 15 - 41 U/L   ALT 238 (H) 17 - 63 U/L   Alkaline Phosphatase 80 38 - 126 U/L   Total Bilirubin 1.0 0.3 - 1.2 mg/dL   GFR calc non Af Amer >60 >60 mL/min   GFR calc Af Amer >60 >60 mL/min   Anion gap 9 5 -  15  Glucose, capillary  Result  Value Ref Range   Glucose-Capillary 92 65 - 99 mg/dL  Glucose, capillary  Result Value Ref Range   Glucose-Capillary 112 (H) 65 - 99 mg/dL  Glucose, capillary  Result Value Ref Range   Glucose-Capillary 108 (H) 65 - 99 mg/dL  Glucose, capillary  Result Value Ref Range   Glucose-Capillary 87 65 - 99 mg/dL  Surgical pathology  Result Value Ref Range   SURGICAL PATHOLOGY      Surgical Pathology CASE: [id number] PATIENT: Joie BimlerYAN Perales Surgical Pathology Report     SPECIMEN SUBMITTED: A. Gastric remnants  CLINICAL HISTORY: None provided  PRE-OPERATIVE DIAGNOSIS: Morbid obesity  POST-OPERATIVE DIAGNOSIS: Same as pre-op     DIAGNOSIS: A. STOMACH; GASTRIC RESTRICTIVE DUODENAL PROCEDURE (DUODENAL SWITCH): - NO PATHOLOGIC CHANGES.   GROSS DESCRIPTION: A. Labeled: gastric remnant Tissue fragment(s): 1 Size: 23.6 x 4.6 x 3.8 cm Description: grossly intact and unoriented fragment of stomach with a 18.7 cm long intact staple line  Block summary 1-representative en face stapled margins 2-representative sections of the wall  Final Diagnosis performed by Ronald LoboMary Olney, MD.  Electronically signed 11/03/2015 4:33:11PM    The electronic signature indicates that the named Attending Pathologist has evaluated the specimen  Technical component performed at [facility], [address], [place], [place] [address] Lab: [phone] Dir: Titus DubinWilliam F . Cato MulliganHancock, MD  Professional component performed at [facility], [facility], [address], [place], [place] [address] Lab: [phone] Dir: Georgiann Cockerara C. Rubinas, MD        Assessment & Plan:   Problem List Items Addressed This Visit      Cardiovascular and Mediastinum   Essential hypertension, benign    Continue current medicines, but I am hopeful that as he loses more weight, we'll decrease or even stop the CCB; continue the ACE-I for now; encouragement given for ongoing weight loss        Endocrine   Type 2  diabetes mellitus (HCC)    Change metofrmin to short-acting version      Relevant Medications   metFORMIN (GLUCOPHAGE) 500 MG tablet   Other Relevant Orders   Hemoglobin A1c   Lipid panel     Other   Morbid obesity (HCC)    Patient continuing to lose weight s/p bariatric surgery (duodenal switch)      Relevant Medications   metFORMIN (GLUCOPHAGE) 500 MG tablet   Elevated SGOT (AST)    Check LFTs      Relevant Orders   COMPLETE METABOLIC PANEL WITH GFR   Elevated serum glutamic pyruvic transaminase (SGPT) level    Check LFTs; noted that these were high in the hospital, not sure why; patient is asymptomatic      Relevant Orders   COMPLETE METABOLIC PANEL WITH GFR    Other Visit Diagnoses   None.      Follow up plan: Return in about 6 weeks (around 01/10/2016) for blood pressure follow-up.  An after-visit summary was printed and given to the patient at check-out.  Please see the patient instructions which may contain other information and recommendations beyond what is mentioned above in the assessment and plan.  Meds ordered this encounter  Medications  . metFORMIN (GLUCOPHAGE) 500 MG tablet    Sig: Take 1 tablet (500 mg total) by mouth 2 (two) times daily with a meal.    Dispense:  180 tablet    Refill:  3    We're changing from long-acting to short-acting  . calcium gluconate 500  MG tablet    Sig: Take 1 tablet by mouth daily.  . Pediatric Multiple Vitamins (CHEWABLE MULTIPLE VITAMINS/C PO)    Sig: Take by mouth daily.    Orders Placed This Encounter  Procedures  . Hemoglobin A1c  . Lipid panel  . COMPLETE METABOLIC PANEL WITH GFR

## 2015-11-29 NOTE — Patient Instructions (Addendum)
Try quinoa for protein Try to limit cheese As your blood pressure comes down, we'll be decreasing the amlodipine first Change the metformin to the short-acting version Keep up the great job with weight loss

## 2015-11-29 NOTE — Assessment & Plan Note (Signed)
Check LFTs; noted that these were high in the hospital, not sure why; patient is asymptomatic

## 2015-11-29 NOTE — Assessment & Plan Note (Signed)
Change metofrmin to short-acting version

## 2015-11-29 NOTE — Assessment & Plan Note (Signed)
Patient continuing to lose weight s/p bariatric surgery (duodenal switch)

## 2015-11-29 NOTE — Assessment & Plan Note (Signed)
Continue current medicines, but I am hopeful that as he loses more weight, we'll decrease or even stop the CCB; continue the ACE-I for now; encouragement given for ongoing weight loss

## 2015-12-13 ENCOUNTER — Other Ambulatory Visit: Payer: Self-pay | Admitting: Family Medicine

## 2015-12-13 NOTE — Telephone Encounter (Signed)
Last cmp reviewed; Rx sent

## 2015-12-17 ENCOUNTER — Telehealth: Payer: Self-pay | Admitting: Family Medicine

## 2015-12-17 NOTE — Telephone Encounter (Signed)
I recommend he go to urgent care for this; it is very unusual for men to get urinary tract infections, so it may be something else and we would definitely want his urine checked and get a culture

## 2015-12-17 NOTE — Telephone Encounter (Signed)
Patient feels that he has a UTI. It burns when he urinate. It has been going on for 2 days. He did have weight loss surgery last month 11/02/15. Patient uses [facility]. He is asking for a return call

## 2015-12-17 NOTE — Telephone Encounter (Signed)
Pt stated he will try to go to urgent care over the weekend. If not he will try to call us back Monday morning to see if he can get worked in for an appointment.

## 2016-01-12 ENCOUNTER — Ambulatory Visit: Payer: Managed Care, Other (non HMO) | Admitting: Family Medicine

## 2016-01-18 ENCOUNTER — Other Ambulatory Visit: Payer: Self-pay | Admitting: Family Medicine

## 2016-01-18 MED ORDER — AMLODIPINE BESYLATE 10 MG PO TABS: 10.0000 mg | ORAL_TABLET | Freq: Every day | ORAL | 0 refills | Status: DC

## 2016-01-18 NOTE — Progress Notes (Signed)
Rx approved

## 2016-02-02 ENCOUNTER — Encounter: Payer: Self-pay | Admitting: Family Medicine

## 2016-02-02 ENCOUNTER — Ambulatory Visit (INDEPENDENT_AMBULATORY_CARE_PROVIDER_SITE_OTHER): Payer: Managed Care, Other (non HMO) | Admitting: Family Medicine

## 2016-02-02 VITALS — BP 136/80 | HR 99 | Temp 98.1°F | Resp 16 | Ht 74.0 in | Wt >= 6400 oz

## 2016-02-02 DIAGNOSIS — E119 Type 2 diabetes mellitus without complications: Secondary | ICD-10-CM | POA: Diagnosis not present

## 2016-02-02 DIAGNOSIS — Z23 Encounter for immunization: Secondary | ICD-10-CM | POA: Diagnosis not present

## 2016-02-02 DIAGNOSIS — I10 Essential (primary) hypertension: Secondary | ICD-10-CM | POA: Diagnosis not present

## 2016-02-02 NOTE — Progress Notes (Signed)
BP 136/80 (BP Location: Left Arm, Patient Position: Sitting, Cuff Size: Normal)   Pulse 99   Temp 98.1 F (36.7 C) (Oral)   Resp 16   Ht 6\' 2"  (1.88 m)   Wt (!) 419 lb 7 oz (190.3 kg)   SpO2 98%   BMI 53.85 kg/m    Subjective:    Patient ID: Walter Parks, male    DOB: Nov 21, 1985, 30 y.o.   MRN: [id number]  HPI: Walter Parks is a 30 y.o. male  Chief Complaint  Patient presents with  . Follow-up    fasting labs   Status post bariatric surgery Highest weight was 563 pounds; always been heavy as a child No stomach pain, no fevers, no blood in the stool Knows to stay away from NSAIDs On multiple vitamin, calcium with iron  Type 2 diabetes; A1c is falling; 7.4 to 7.1 to 5.7 after surgery, just in Sept No problems with dry mouth or blurred vision; just occasionally dry mouth, can't eat or drink together Not getting up at night to urinate Not eating bread; no soft drinks No problems with the feet; went to foot doctor;   Low HDL; last check was 2 months ago; LDL dropped from 114 to 82;   Depression screen [facility] 2/9 02/02/2016 11/29/2015 08/02/2015  Decreased Interest 0 0 0  Down, Depressed, Hopeless 0 0 0  PHQ - 2 Score 0 0 0   Relevant past medical, surgical, family and social history reviewed Past Medical History:  Diagnosis Date  . Depression   . Diabetes mellitus without complication (HCC)   . GERD (gastroesophageal reflux disease)   . Hx of tobacco use, presenting hazards to health 06/25/2015  . Hypertension   . Obesity   . Sleep apnea    on an APAP machine   Past Surgical History:  Procedure Laterality Date  . LAPAROSCOPIC GASTRIC RESTRICTIVE DUODENAL PROCEDURE (DUODENAL SWITCH) N/A 11/02/2015   Procedure: LAPAROSCOPIC GASTRIC RESTRICTIVE DUODENAL PROCEDURE (DUODENAL SWITCH);  Surgeon: Everette Rank, MD;  Location: [facility] ORS;  Service: General;  Laterality: N/A;  . TONSILLECTOMY    . TONSILLECTOMY     Social History  Substance Use Topics  . Smoking status:  Current Some Day Smoker    Packs/day: 0.00    Years: 18.00    Types: Cigarettes    Last attempt to quit: 08/01/2015  . Smokeless tobacco: Never Used  . Alcohol use 0.0 oz/week     Comment: socially   Interim medical history since last visit reviewed. Allergies and medications reviewed  Review of Systems Per HPI unless specifically indicated above     Objective:    BP 136/80 (BP Location: Left Arm, Patient Position: Sitting, Cuff Size: Normal)   Pulse 99   Temp 98.1 F (36.7 C) (Oral)   Resp 16   Ht 6\' 2"  (1.88 m)   Wt (!) 419 lb 7 oz (190.3 kg)   SpO2 98%   BMI 53.85 kg/m   Wt Readings from Last 3 Encounters:  02/02/16 (!) 419 lb 7 oz (190.3 kg)  11/29/15 (!) 472 lb 3.2 oz (214.2 kg)  11/02/15 (!) 516 lb (234.1 kg)    Physical Exam  Constitutional: He appears well-developed and well-nourished.  Morbidly obese; weight loss acknowledged, almost 100 pounds over last 3 months  Eyes: EOM are normal. No scleral icterus.  Neck: No JVD present.  Cardiovascular: Regular rhythm.   Heart sounds are distant due to morbid obesity  Pulmonary/Chest: Effort normal. He  has decreased breath sounds.  Decreased breath sounds throughout secondary to morbid obesity  Musculoskeletal: He exhibits no edema.       Right foot: There is deformity (pes planus).       Left foot: There is deformity (pes planus).  Neurological: He is alert.  Skin: No pallor.  Psychiatric: He has a normal mood and affect.   Diabetic Foot Form - Detailed   Diabetic Foot Exam - detailed Diabetic Foot exam was performed with the following findings:  Yes 02/02/2016 10:15 AM  Visual Foot Exam completed.:  Yes  Are the toenails long?:  Yes Are the toenails ingrown?:  No Normal Range of Motion:  Yes Pulse Foot Exam completed.:  Yes  Right Dorsalis Pedis:  Present Left Dorsalis Pedis:  Present  Sensory Foot Exam Completed.:  Yes Swelling:  No Semmes-Weinstein Monofilament Test R Site 1-Great Toe:  Pos L Site  1-Great Toe:  Pos  R Site 4:  Pos L Site 4:  Pos  R Site 5:  Pos L Site 5:  Pos       Results for orders placed or performed in visit on 11/29/15  Hemoglobin A1c  Result Value Ref Range   Hgb A1c MFr Bld 5.7 (H) <5.7 %   Mean Plasma Glucose 117 mg/dL  Lipid panel  Result Value Ref Range   Cholesterol 130 125 - 200 mg/dL   Triglycerides 956110 <213<150 mg/dL   HDL 26 (L) >=08>=40 mg/dL   Total CHOL/HDL Ratio 5.0 <=5.0 Ratio   VLDL 22 <30 mg/dL   LDL Cholesterol 82 <657<130 mg/dL  COMPLETE METABOLIC PANEL WITH GFR  Result Value Ref Range   Sodium 139 135 - 146 mmol/L   Potassium 4.0 3.5 - 5.3 mmol/L   Chloride 103 98 - 110 mmol/L   CO2 25 20 - 31 mmol/L   Glucose, Bld 96 65 - 99 mg/dL   BUN 6 (L) 7 - 25 mg/dL   Creat 8.460.92 9.620.60 - 9.521.35 mg/dL   Total Bilirubin 0.6 0.2 - 1.2 mg/dL   Alkaline Phosphatase 86 40 - 115 U/L   AST 30 10 - 40 U/L   ALT 38 9 - 46 U/L   Total Protein 7.5 6.1 - 8.1 g/dL   Albumin 4.2 3.6 - 5.1 g/dL   Calcium 84.110.0 8.6 - 32.410.3 mg/dL   GFR, Est African American >89 >=60 mL/min   GFR, Est Non African American >89 >=60 mL/min      Assessment & Plan:   Problem List Items Addressed This Visit      Cardiovascular and Mediastinum   Essential hypertension, benign (Chronic)    Near goal; expect numbers to continue to improve after bariatric surgery with ongoing weight loss        Endocrine   Type 2 diabetes mellitus (HCC) - Primary (Chronic)    So pleased to see his weight loss; A1c had dropped to 5.7 after bariatric surgery; expect this trend to continue; foot exam by MD today        Other   Morbid obesity (HCC)    So pleased to see patient's success with his weight loss after surgery; encouragement given       Other Visit Diagnoses    Need for diphtheria-tetanus-pertussis (Tdap) vaccine       Relevant Orders   Tdap vaccine greater than or equal to 7yo IM (Completed)       Follow up plan: Return in about 4 months (around 05/29/2016) for fasting labs  for  sugar, cholesterol.  An after-visit summary was printed and given to the patient at check-out.  Please see the patient instructions which may contain other information and recommendations beyond what is mentioned above in the assessment and plan.  No orders of the defined types were placed in this encounter.   Orders Placed This Encounter  Procedures  . Tdap vaccine greater than or equal to 7yo IM

## 2016-02-02 NOTE — Assessment & Plan Note (Signed)
Near goal; expect numbers to continue to improve after bariatric surgery with ongoing weight loss

## 2016-02-02 NOTE — Patient Instructions (Addendum)
Try to limit saturated fats in your diet (bologna, hot dogs, barbeque, cheeseburgers, hamburgers, steak, bacon, sausage, cheese, etc.) and get more fresh fruits, vegetables, and whole grains Please do see your eye doctor regularly, and have your eyes examined every year (or more often per his or her recommendation) Check your feet every night and let me know right away of any sores, infections, numbness, etc. Try to limit sweets, white bread, white rice, white potatoes It is okay with me for you to not check your fingerstick blood sugars (per [employer] Best Practices), unless you are interested and feel it would be helpful for you  You received the vaccine to protect against tetanus and diphtheria and pertussis today; the tetanus and diphtheria portions will provide protection up to ten years, and the pertussis component will give you protection against whooping cough for life

## 2016-02-02 NOTE — Assessment & Plan Note (Signed)
So pleased to see patient's success with his weight loss after surgery; encouragement given

## 2016-02-02 NOTE — Assessment & Plan Note (Signed)
So pleased to see his weight loss; A1c had dropped to 5.7 after bariatric surgery; expect this trend to continue; foot exam by MD today

## 2016-04-10 LAB — HM DIABETES EYE EXAM

## 2016-05-31 ENCOUNTER — Ambulatory Visit (INDEPENDENT_AMBULATORY_CARE_PROVIDER_SITE_OTHER): Payer: Commercial Managed Care - PPO | Admitting: Family Medicine

## 2016-05-31 ENCOUNTER — Encounter: Payer: Self-pay | Admitting: Family Medicine

## 2016-05-31 DIAGNOSIS — Z5181 Encounter for therapeutic drug level monitoring: Secondary | ICD-10-CM

## 2016-05-31 DIAGNOSIS — N181 Chronic kidney disease, stage 1: Secondary | ICD-10-CM

## 2016-05-31 DIAGNOSIS — I10 Essential (primary) hypertension: Secondary | ICD-10-CM | POA: Diagnosis not present

## 2016-05-31 DIAGNOSIS — E0822 Diabetes mellitus due to underlying condition with diabetic chronic kidney disease: Secondary | ICD-10-CM

## 2016-05-31 MED ORDER — LISINOPRIL 10 MG PO TABS: 15.0000 mg | ORAL_TABLET | Freq: Every day | ORAL | 0 refills | Status: DC

## 2016-05-31 NOTE — Assessment & Plan Note (Addendum)
Not quite to goal; increase ACE-I to 15 mg daily; avoid salt substitutes; avoid fruit smoothies; recheck Cr and K+ in two weeks

## 2016-05-31 NOTE — Assessment & Plan Note (Addendum)
Check labs, Cr and K+ in two weeks with increase in ACE-I

## 2016-05-31 NOTE — Progress Notes (Signed)
BP (!) 144/95 (BP Location: Right Arm, Patient Position: Sitting, Cuff Size: Large)   Pulse 87   Temp 98.3 F (36.8 C) (Oral)   Resp 16   Wt (!) 378 lb 4 oz (171.6 kg)   SpO2 99%   BMI 48.56 kg/m    Subjective:    Patient ID: Walter Parks, male    DOB: 20-Apr-1985, 31 y.o.   MRN: [id number]  HPI: Walter Parks is a 31 y.o. male  Chief Complaint  Patient presents with  . Diabetes  . Hypertension   Type 2 diabetes; doing pretty well; always has dry mouth; not severe; no blurred vision; recent visit to eye doctor, no retinopathy; limiting sweetened drinks; not much bread at all; no sores or calluses on feet Last A1c was 5.7 in September; still taking metformin  HTN; he has not taken his medicine today; he works second shift, running late; when he checks his BP, usually 130s/high 70s to 80s  Low HDL 26 on last check  Obesity; good energy; losing weight since surgery; saw surgeon last week; vitamins  Depression screen [facility] 2/9 05/31/2016 02/02/2016 11/29/2015 08/02/2015  Decreased Interest 0 0 0 0  Down, Depressed, Hopeless 0 0 0 0  PHQ - 2 Score 0 0 0 0   Relevant past medical, surgical, family and social history reviewed Past Medical History:  Diagnosis Date  . Depression   . Diabetes mellitus without complication (HCC)   . GERD (gastroesophageal reflux disease)   . Hx of tobacco use, presenting hazards to health 06/25/2015  . Hypertension   . Obesity   . Sleep apnea    on an APAP machine   Past Surgical History:  Procedure Laterality Date  . LAPAROSCOPIC GASTRIC RESTRICTIVE DUODENAL PROCEDURE (DUODENAL SWITCH) N/A 11/02/2015   Procedure: LAPAROSCOPIC GASTRIC RESTRICTIVE DUODENAL PROCEDURE (DUODENAL SWITCH);  Surgeon: Everette Rank, MD;  Location: [facility] ORS;  Service: General;  Laterality: N/A;  . TONSILLECTOMY    . TONSILLECTOMY     Family History  Problem Relation Age of Onset  . Hypertension Mother   . Hypertension Father   . Diabetes Maternal Aunt   . Hypertension  Maternal Grandmother   . Hypertension Maternal Grandfather   . Stroke Paternal Grandmother   . Hypertension Paternal Grandmother   . Hypertension Paternal Grandfather   . Diabetes Cousin   . Heart disease Neg Hx   . COPD Neg Hx    Social History  Substance Use Topics  . Smoking status: Current Some Day Smoker    Packs/day: 0.00    Years: 18.00    Types: Cigarettes    Last attempt to quit: 08/01/2015  . Smokeless tobacco: Never Used  . Alcohol use 0.0 oz/week     Comment: socially   Interim medical history since last visit reviewed. Allergies and medications reviewed  Review of Systems Per HPI unless specifically indicated above     Objective:    BP (!) 144/95 (BP Location: Right Arm, Patient Position: Sitting, Cuff Size: Large)   Pulse 87   Temp 98.3 F (36.8 C) (Oral)   Resp 16   Wt (!) 378 lb 4 oz (171.6 kg)   SpO2 99%   BMI 48.56 kg/m   Wt Readings from Last 3 Encounters:  05/31/16 (!) 378 lb 4 oz (171.6 kg)  02/02/16 (!) 419 lb 7 oz (190.3 kg)  11/29/15 (!) 472 lb 3.2 oz (214.2 kg)    Physical Exam  Constitutional: He appears well-developed and  well-nourished.  Morbidly obese; weight down 94 pounds over last six months  HENT:  Head: Normocephalic and atraumatic.  Eyes: EOM are normal. No scleral icterus.  Cardiovascular: Normal rate and regular rhythm.   Pulmonary/Chest: Effort normal and breath sounds normal.  Abdominal: He exhibits no distension. There is no tenderness.  Musculoskeletal: He exhibits no edema.  Neurological: He is alert.  Psychiatric: He has a normal mood and affect. His behavior is normal.   Diabetic Foot Form - Detailed   Diabetic Foot Exam - detailed Diabetic Foot exam was performed with the following findings:  Yes 06/11/2016  6:40 PM  Visual Foot Exam completed.:  Yes  Are the toenails ingrown?:  No Normal Range of Motion:  Yes Pulse Foot Exam completed.:  Yes  Right Dorsalis Pedis:  Present Left Dorsalis Pedis:  Present    Sensory Foot Exam Completed.:  Yes Swelling:  No Semmes-Weinstein Monofilament Test R Site 1-Great Toe:  Pos L Site 1-Great Toe:  Pos  R Site 4:  Pos L Site 4:  Pos  R Site 5:  Pos L Site 5:  Pos       Results for orders placed or performed in visit on 04/25/16  HM DIABETES EYE EXAM  Result Value Ref Range   HM Diabetic Eye Exam Retinopathy (A) No Retinopathy   MD note: above is actually incorrect; he does not have retinopathy; the diabetic eye exam was done on Apr 10, 2016 by Dr. Janee Morn and showed NO diabetic retinopathy; see under scanned documents in media tab     Assessment & Plan:   Problem List Items Addressed This Visit      Cardiovascular and Mediastinum   Essential hypertension, benign (Chronic)    Not quite to goal; increase ACE-I to 15 mg daily; avoid salt substitutes; avoid fruit smoothies; recheck Cr and K+ in two weeks      Relevant Medications   lisinopril (PRINIVIL,ZESTRIL) 10 MG tablet     Endocrine   Diabetes mellitus due to underlying condition with stage 1 chronic kidney disease, without long-term current use of insulin (HCC)    Marked improvement of A1c with significant weight loss      Relevant Medications   lisinopril (PRINIVIL,ZESTRIL) 10 MG tablet   Other Relevant Orders   Hemoglobin A1c   Lipid panel   Microalbumin / creatinine urine ratio     Other   Morbid obesity (HCC)    Patient continues to lose weight after bariatric surgery; down almost 100 pounds over last 6 months; f/u with bariatric surgeon; continue vitamins; avoid NSAIDs      Medication monitoring encounter    Check labs, Cr and K+ in two weeks with increase in ACE-I      Relevant Orders   COMPLETE METABOLIC PANEL WITH GFR       Follow up plan: Return in about 2 weeks (around 06/14/2016) for fasting labs only; six months after that with Dr. Sherie Don.  An after-visit summary was printed and given to the patient at check-out.  Please see the patient instructions which  may contain other information and recommendations beyond what is mentioned above in the assessment and plan.  Meds ordered this encounter  Medications  . lisinopril (PRINIVIL,ZESTRIL) 10 MG tablet    Sig: Take 1.5 tablets (15 mg total) by mouth daily.    Dispense:  135 tablet    Refill:  0    Increased dose    Orders Placed This Encounter  Procedures  .  COMPLETE METABOLIC PANEL WITH GFR  . Hemoglobin A1c  . Lipid panel  . Microalbumin / creatinine urine ratio

## 2016-05-31 NOTE — Patient Instructions (Signed)
Return in 2 weeks for fasting labs Increase the lisinopril to 15 mg daily (that will be 1-1/2 pills of the 10 mg strength) Keep up the amazing job with weight loss Your goal blood pressure is less than 130 mmHg on top. Try to follow the DASH guidelines (DASH stands for Dietary Approaches to Stop Hypertension) Try to limit the sodium in your diet.  Ideally, consume less than 1.5 grams (less than 1,500mg ) per day. Do not add salt when cooking or at the table.  Check the sodium amount on labels when shopping, and choose items lower in sodium when given a choice. Avoid or limit foods that already contain a lot of sodium. Eat a diet rich in fruits and vegetables and whole grains. Avoid salt substitutes

## 2016-06-11 NOTE — Assessment & Plan Note (Signed)
Marked improvement of A1c with significant weight loss

## 2016-06-11 NOTE — Assessment & Plan Note (Signed)
Patient continues to lose weight after bariatric surgery; down almost 100 pounds over last 6 months; f/u with bariatric surgeon; continue vitamins; avoid NSAIDs

## 2016-06-13 ENCOUNTER — Other Ambulatory Visit: Payer: Self-pay | Admitting: Family Medicine

## 2016-06-14 ENCOUNTER — Other Ambulatory Visit: Payer: Self-pay | Admitting: Family Medicine

## 2016-08-28 ENCOUNTER — Other Ambulatory Visit: Payer: Self-pay | Admitting: Family Medicine

## 2016-08-28 NOTE — Telephone Encounter (Signed)
Please ask patient to have the labs done that were ordered in March; thank you I'll provide a few of the lisinopril so he doesn't run out Thank you

## 2016-08-29 NOTE — Telephone Encounter (Signed)
Left voice mail

## 2016-12-06 ENCOUNTER — Ambulatory Visit: Payer: Commercial Managed Care - PPO | Admitting: Family Medicine

## 2017-01-11 ENCOUNTER — Encounter: Payer: Self-pay | Admitting: Urology

## 2017-01-11 ENCOUNTER — Ambulatory Visit (INDEPENDENT_AMBULATORY_CARE_PROVIDER_SITE_OTHER): Payer: Commercial Managed Care - PPO | Admitting: Urology

## 2017-01-11 VITALS — BP 161/95 | HR 84 | Ht 74.0 in | Wt 353.4 lb

## 2017-01-11 DIAGNOSIS — Z3009 Encounter for other general counseling and advice on contraception: Secondary | ICD-10-CM

## 2017-01-11 MED ORDER — DIAZEPAM 10 MG PO TABS: 10.0000 mg | ORAL_TABLET | Freq: Once | ORAL | 0 refills | Status: AC

## 2017-01-11 NOTE — Progress Notes (Signed)
01/11/2017 9:44 AM   Orion Modestyan L Olexa [birth year] [id number]  Referring provider: Kerman PasseyLada, Melinda P, MD [address] [place], [place] [id number]  Chief Complaint  Patient presents with  . VAS Consult    HPI: The patient is a 31 year old gentleman with no GU history presents today to discuss a vasectomy.  He has 2 children and desires no more.  He denies history of nephrolithiasis or urinary tract infection.  He voids well.  He does have morbid obesity and has undergone gastric bypass surgery for this.   PMH: Past Medical History:  Diagnosis Date  . Depression   . Diabetes mellitus without complication (HCC)   . GERD (gastroesophageal reflux disease)   . Hx of tobacco use, presenting hazards to health 06/25/2015  . Hypertension   . Obesity   . Sleep apnea    on an APAP machine    Surgical History: Past Surgical History:  Procedure Laterality Date  . LAPAROSCOPIC GASTRIC RESTRICTIVE DUODENAL PROCEDURE (DUODENAL SWITCH) N/A 11/02/2015   Procedure: LAPAROSCOPIC GASTRIC RESTRICTIVE DUODENAL PROCEDURE (DUODENAL SWITCH);  Surgeon: Everette RankMichael A Tyner, MD;  Location: [facility] ORS;  Service: General;  Laterality: N/A;  . TONSILLECTOMY    . TONSILLECTOMY      Home Medications:  Allergies as of 01/11/2017      Reactions   Lemon Flavor Hives      Medication List       Accurate as of 01/11/17  9:44 AM. Always use your most recent med list.          amLODipine 10 MG tablet Commonly known as:  NORVASC TAKE 1 TABLET (10 MG TOTAL) BY MOUTH DAILY.   calcium gluconate 500 MG tablet Take 1 tablet by mouth daily.   lisinopril 10 MG tablet Commonly known as:  PRINIVIL,ZESTRIL TAKE 1.5 TABLETS (15 MG TOTAL) BY MOUTH DAILY.   metFORMIN 500 MG tablet Commonly known as:  GLUCOPHAGE Take 1 tablet (500 mg total) by mouth 2 (two) times daily with a meal.   multivitamin with minerals tablet Take 1 tablet by mouth daily.       Allergies:  Allergies  Allergen Reactions  . Lemon  Flavor Hives    Family History: Family History  Problem Relation Age of Onset  . Hypertension Mother   . Hypertension Father   . Diabetes Maternal Aunt   . Hypertension Maternal Grandmother   . Hypertension Maternal Grandfather   . Stroke Paternal Grandmother   . Hypertension Paternal Grandmother   . Hypertension Paternal Grandfather   . Diabetes Cousin   . Heart disease Neg Hx   . COPD Neg Hx     Social History:  reports that he has been smoking Cigarettes.  He has been smoking about 0.00 packs per day for the past 18.00 years. He has never used smokeless tobacco. He reports that he drinks alcohol. He reports that he does not use drugs.  ROS: UROLOGY Frequent Urination?: No Hard to postpone urination?: No Burning/pain with urination?: No Get up at night to urinate?: No Leakage of urine?: No Urine stream starts and stops?: No Trouble starting stream?: No Do you have to strain to urinate?: No Blood in urine?: No Urinary tract infection?: No Sexually transmitted disease?: No Injury to kidneys or bladder?: No Painful intercourse?: No Weak stream?: No Erection problems?: No Penile pain?: No  Gastrointestinal Nausea?: No Vomiting?: No Indigestion/heartburn?: No Diarrhea?: No Constipation?: No  Constitutional Fever: No Night sweats?: No Weight loss?: No Fatigue?: No  Skin  Skin rash/lesions?: No Itching?: No  Eyes Blurred vision?: No Double vision?: No  Ears/Nose/Throat Sore throat?: No Sinus problems?: No  Hematologic/Lymphatic Swollen glands?: No Easy bruising?: No  Cardiovascular Leg swelling?: No Chest pain?: No  Respiratory Cough?: No Shortness of breath?: No  Endocrine Excessive thirst?: No  Musculoskeletal Back pain?: No Joint pain?: No  Neurological Headaches?: No Dizziness?: No  Psychologic Depression?: No Anxiety?: No  Physical Exam: BP (!) 161/95 (BP Location: Right Arm, Patient Position: Sitting, Cuff Size: Large)    Pulse 84   Ht 6\' 2"  (1.88 m)   Wt (!) 353 lb 6.4 oz (160.3 kg)   BMI 45.37 kg/m   Constitutional:  Alert and oriented, No acute distress. HEENT: [place] AT, moist mucus membranes.  Trachea midline, no masses. Cardiovascular: No clubbing, cyanosis, or edema. Respiratory: Normal respiratory effort, no increased work of breathing. GI: Abdomen is soft, nontender, nondistended, no abdominal masses GU: No CVA tenderness.  Normal phallus.  Testicles descended bilaterally.  Benign.  Vas palpable easily bilaterally. Skin: No rashes, bruises or suspicious lesions. Lymph: No cervical or inguinal adenopathy. Neurologic: Grossly intact, no focal deficits, moving all 4 extremities. Psychiatric: Normal mood and affect.  Laboratory Data: Lab Results  Component Value Date   WBC 14.8 (H) 11/03/2015   HGB 15.1 11/03/2015   HCT 47.1 11/03/2015   MCV 79.5 (L) 11/03/2015   PLT 303 11/03/2015    Lab Results  Component Value Date   CREATININE 0.92 11/29/2015    No results found for: PSA  No results found for: TESTOSTERONE  Lab Results  Component Value Date   HGBA1C 5.7 (H) 11/29/2015    Urinalysis No results found for: COLORURINE, APPEARANCEUR, LABSPEC, PHURINE, GLUCOSEU, HGBUR, BILIRUBINUR, KETONESUR, PROTEINUR, UROBILINOGEN, NITRITE, LEUKOCYTESUR   Assessment & Plan:    Today, we discussed what the vas deferens is, where it is located, and its function. We reviewed the procedure for vasectomy, it's risks, benefits, alternatives, and likelihood of achieving his goals. We discussed in detail the procedure, complications, and recovery as well as the need for clearance prior to unprotected intercourse. We discussed that vasectomy does not protect against sexually transmitted diseases. We discussed that this procedure does not result in immediate sterility and that they would need to use other forms of birth control until he has been cleared with negative postvasectomy semen analyses. I explained that  the procedure is considered to be permanent and that attempts at reversal have varying degrees of success. These options include vasectomy reversal, sperm retrieval, and in vitro fertilization; these can be very expensive. We discussed the chance of postvasectomy pain syndrome which occurs in less than 5% of patients. I explained to the patient that there is no treatment to resolve this chronic pain, and that if it developed I would not be able to help resolve the issue, but that surgery is generally not needed for correction. I explained there have even been reports of systemic like illness associated with this chronic pain, and that there was no good cure. I explained that vasectomy it is not a 100% reliable form of birth control, and the risk of pregnancy after vasectomy is approximately 1 in 2000 men who had a negative postvasectomy semen analysis or rare non-motile sperm. I explained that repeat vasectomy was necessary in less than 1% of vasectomy procedures when employing the type of technique that I use. I explained that he should refrain from ejaculation for approximately one week following vasectomy. I explained that there are other  options for birth control which are permanent and non-permanent; we discussed these. I explained the rates of surgical complications, such as symptomatic hematoma or infection, are low (1-2%) and vary with the surgeon's experience and criteria used to diagnose the complication.  The patient had the opportunity to ask questions to his stated satisfaction. He voiced understanding of the above factors and stated that he has read all the information provided to him and the packets and informed consent  1. Family planning -Vasectomy  Return for vasectomy.  Hildred Laser, MD  [facility] [address], [address] [place], [place] [address] [phone]

## 2017-02-02 ENCOUNTER — Encounter: Payer: Self-pay | Admitting: Urology

## 2017-02-02 ENCOUNTER — Ambulatory Visit: Payer: Commercial Managed Care - PPO | Admitting: Urology

## 2017-02-02 VITALS — BP 150/83 | HR 76 | Ht 74.0 in | Wt 344.6 lb

## 2017-02-02 DIAGNOSIS — Z3009 Encounter for other general counseling and advice on contraception: Secondary | ICD-10-CM | POA: Diagnosis not present

## 2017-02-02 MED ORDER — OXYCODONE-ACETAMINOPHEN 5-325 MG PO TABS: 1.0000 | ORAL_TABLET | ORAL | 0 refills | Status: DC | PRN

## 2017-02-02 NOTE — Progress Notes (Signed)
Bilateral Vasectomy Procedure  Pre-Procedure: - Patient's scrotum was prepped and draped for vasectomy. - The vas was palpated through the scrotal skin on the left. - 1% Xylocaine was injected into the skin and surrounding tissue for placement  - In a similar manner, the vas on the right was identified, anesthetized, and stabilized.  Procedure: - A scalpel was used to make a 1 cm incision in his left hemiscrotum - The left vas was isolated and brought up through the incision exposing that structure. - Bleeding points were cauterized as they occurred. - The vas was free from the surrounding structures and brought into view. - A segment was positioned for placement with a hemostat. - A second hemostat was placed and a small segment between the two hemostats and was removed for inspection. - Each end of the transected vas lumen was fulgurated/obliterated using needlepoint electrocautery. It was then tied off with a #2-0 silk suture -The same procedure was performed on the right. - A suture of #3-0 chromic catgut was used to close each lateral scrotal skin incision  Post-Procedure: - Patient was instructed in care of the operative area - A specimen is to be delivered in 12 and 16 weeks   -Another form of contraception is to be used until he is cleared by the office.   

## 2017-02-12 ENCOUNTER — Telehealth: Payer: Self-pay | Admitting: Urology

## 2017-02-12 NOTE — Telephone Encounter (Signed)
Patient left a voice mail message to have a return call.  He had a vasectomy on 11/16 with Dr. Sherryl BartersBudzyn.  He has questions about the healing process to ensure that he is healing properly.  He can be reached at [phone].

## 2017-02-12 NOTE — Telephone Encounter (Signed)
LMOM

## 2017-02-12 NOTE — Telephone Encounter (Signed)
Spoke with pt in reference to post vas healing process. All questions you were answered. Pt voiced understanding.

## 2017-02-28 ENCOUNTER — Telehealth: Payer: Self-pay | Admitting: Family Medicine

## 2017-02-28 NOTE — Telephone Encounter (Signed)
Left voice message for patient to schedule appt with Dr Lada °

## 2017-02-28 NOTE — Telephone Encounter (Signed)
Going through old orders Patient did not have the labs ordered in March He is overdue for a visit I'll cancel the outstanding orders Please ask him to schedule a visit with me in the next few weeks and come fasting for labs please

## 2017-03-06 NOTE — Telephone Encounter (Signed)
appt made for 03/22/16, pt informed to come fasting

## 2017-03-22 ENCOUNTER — Ambulatory Visit: Payer: Commercial Managed Care - PPO | Admitting: Family Medicine

## 2017-05-01 ENCOUNTER — Other Ambulatory Visit: Payer: Self-pay

## 2017-05-01 DIAGNOSIS — Z9852 Vasectomy status: Secondary | ICD-10-CM

## 2017-05-02 ENCOUNTER — Other Ambulatory Visit: Payer: Commercial Managed Care - PPO

## 2017-05-02 DIAGNOSIS — Z9852 Vasectomy status: Secondary | ICD-10-CM

## 2017-05-03 LAB — POST-VAS SPERM EVALUATION,QUAL: Volume: 0.8 mL

## 2017-05-07 ENCOUNTER — Telehealth: Payer: Self-pay

## 2017-05-07 NOTE — Telephone Encounter (Signed)
Hildred LaserBudzyn, Brian James, MD  Rupert StacksWatkins, Kitt Ledet C, LPN        Please let patient know that his first semen analysis does not show any sperm. He needs 1 more in 1 month before he will be cleared. Thanks.    Spoke with pt in reference to semen analysis and needing another one in one month. Pt voiced understanding.

## 2017-06-07 ENCOUNTER — Encounter: Payer: Self-pay | Admitting: Family Medicine

## 2018-01-26 IMAGING — RF DG UGI W/O KUB
1 series · 9 of 9 positions shown · non-contrast
Comparison: None in PACs

CLINICAL DATA: Preoperative evaluation prior gastric bypass, morbid
obesity, history of gastroesophageal reflux, diabetes, current
smoker.

EXAM:
UPPER GI SERIES WITHOUT KUB
TECHNIQUE: Routine upper GI series was performed with thin/high density/water
soluble barium.
FLUOROSCOPY TIME:  Fluoroscopy Time (in minutes and seconds): 0
minutes, 54 seconds
Number of Acquired Images:  9

[Series 1: fluoro_barium singleshot_bw · 0.17mm/px · 9 of 9 slices shown]
[im 1/9]
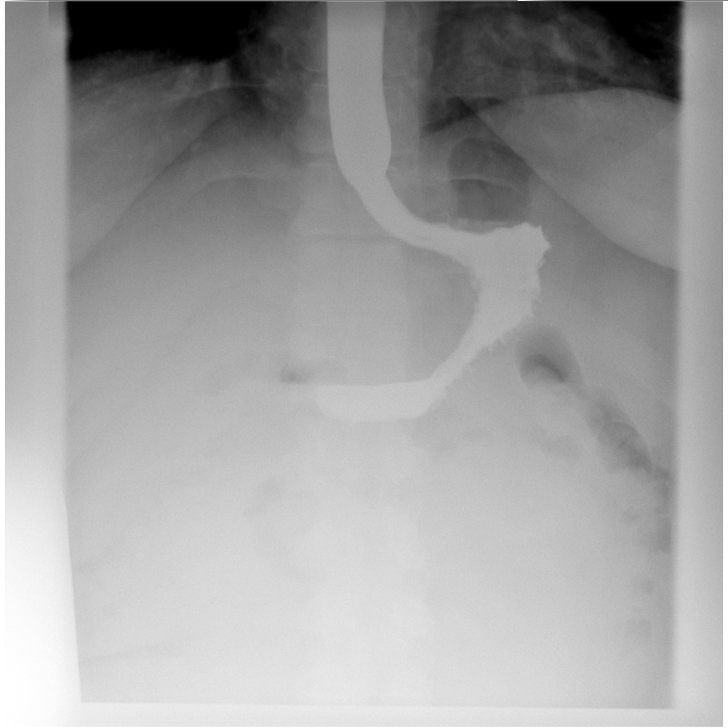
[im 2/9]
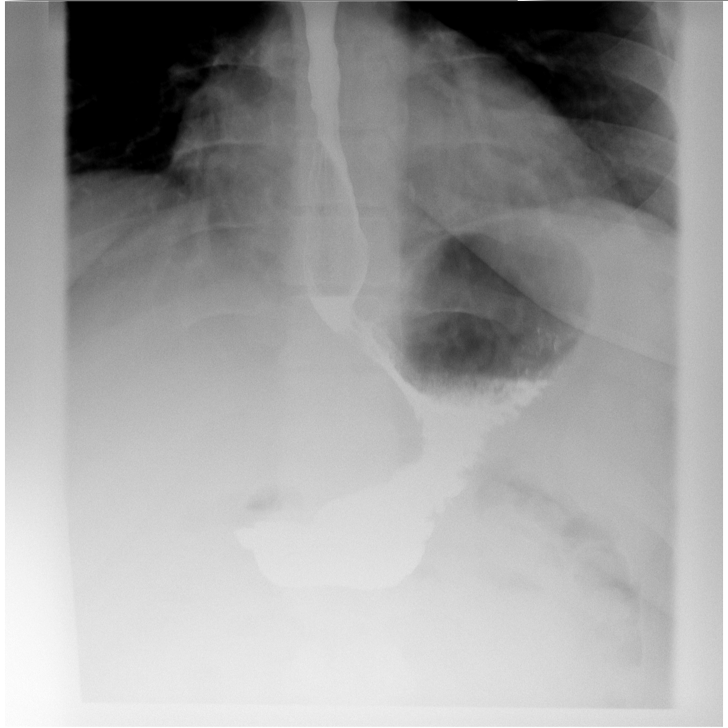
[im 3/9]
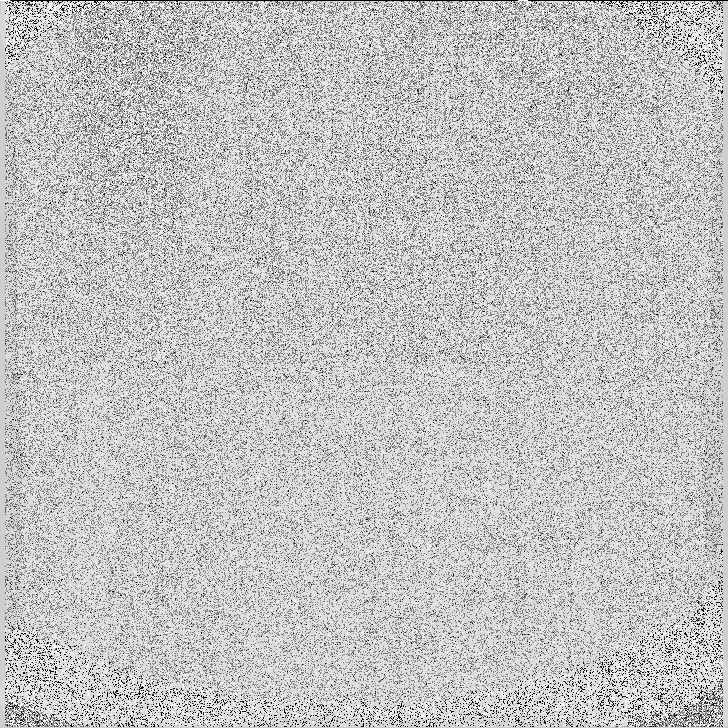
[im 4/9]
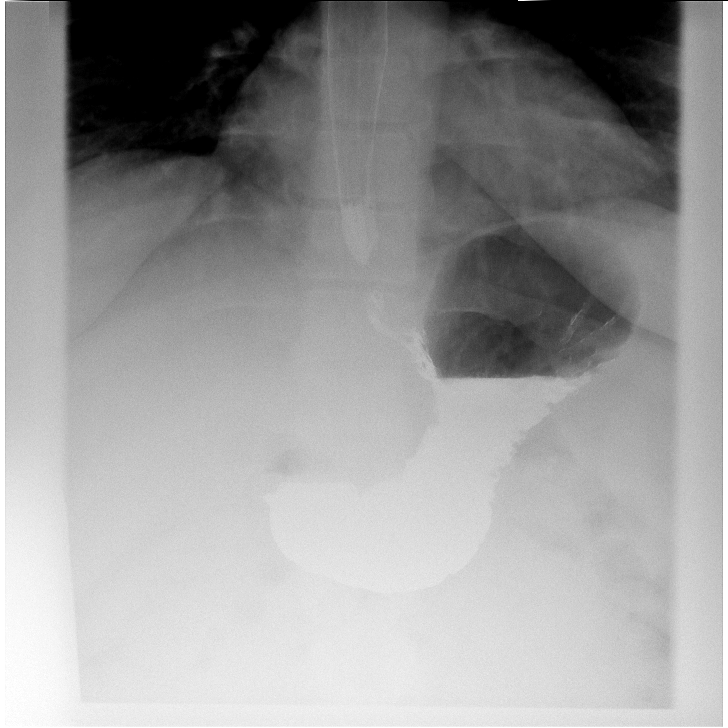
[im 5/9]
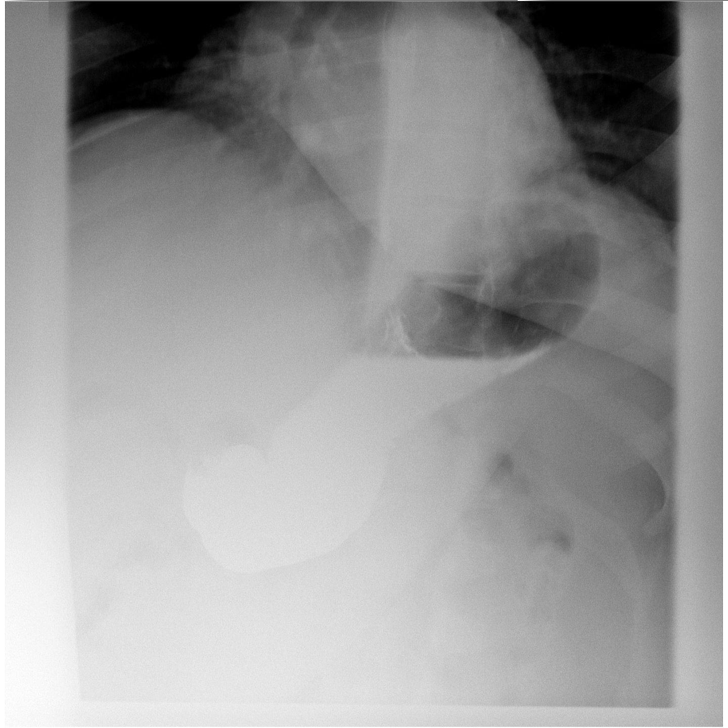
[im 6/9]
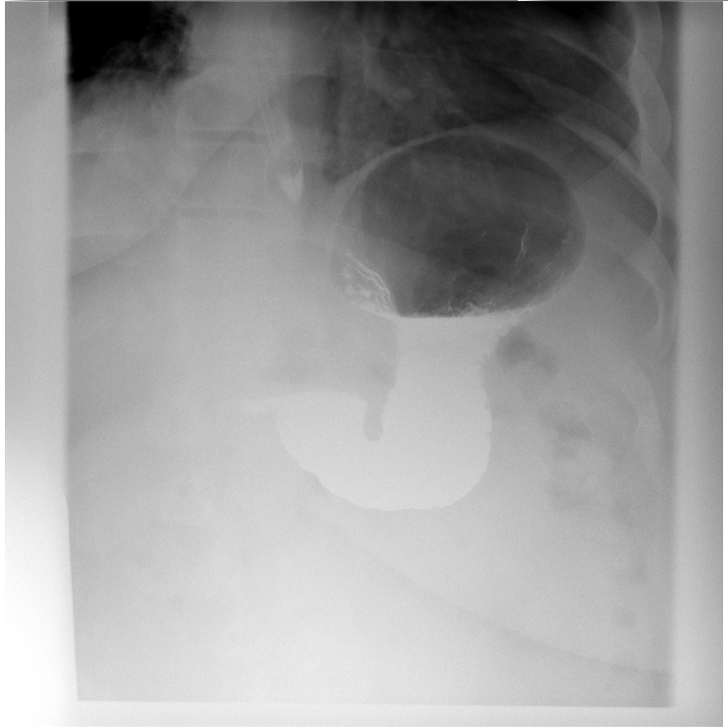
[im 7/9]
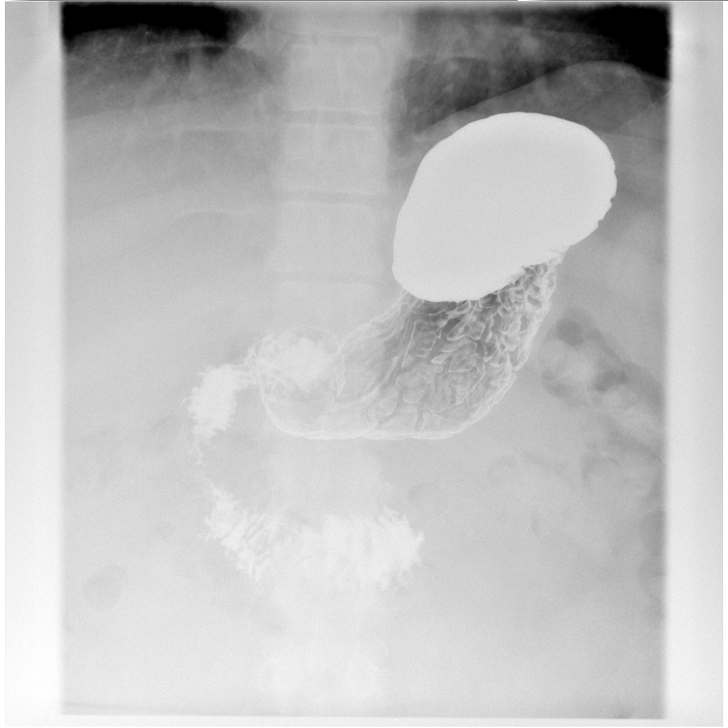
[im 8/9]
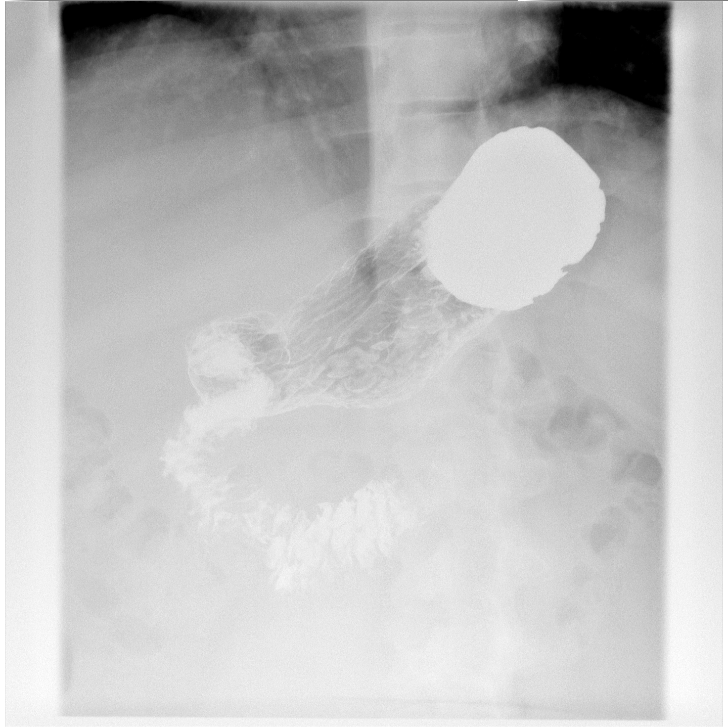
[im 9/9]
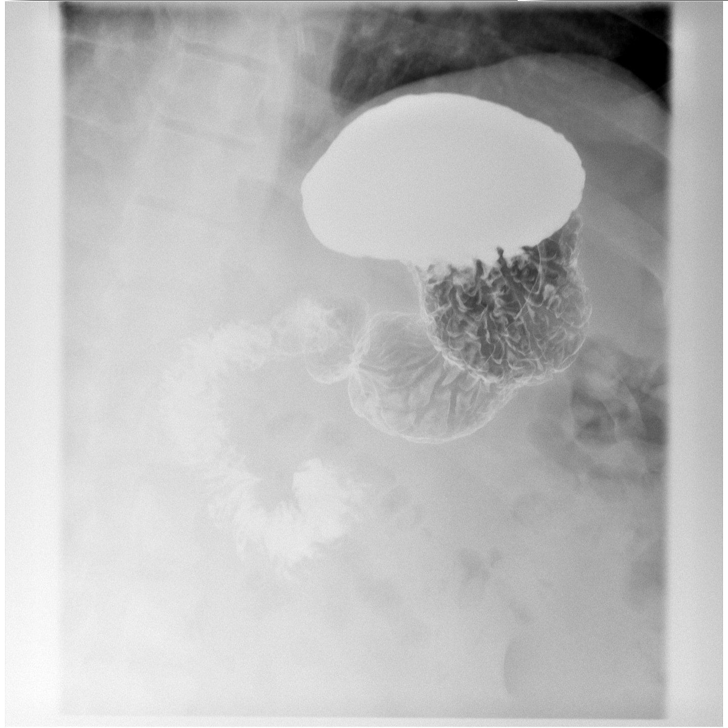

[9 of 9 positions shown; findings below may reference images not displayed]

FINDINGS: Study was modified due to the patient's weight exceeding the sake
table limits. The patient ingested the thick the barium tablet
passed without difficulty.

In the supine position the patient ingested thin barium which
revealed normal esophageal motility and no gastric abnormalities.
Barium and gas forming crystals in the upright position. The
esophagus was normal in a survey fashion. There was no
gastroesophageal reflux. The stomach distended well. The mucosal
fold pattern was normal. There was no ulcer niche. Gastric emptying
was prompt. The duodenal bulb and Celsius sweep were normal.
IMPRESSION: Normal upper GI series.

## 2018-04-15 DIAGNOSIS — B351 Tinea unguium: Secondary | ICD-10-CM | POA: Diagnosis not present

## 2018-04-15 DIAGNOSIS — M79675 Pain in left toe(s): Secondary | ICD-10-CM | POA: Diagnosis not present

## 2018-04-15 DIAGNOSIS — L03032 Cellulitis of left toe: Secondary | ICD-10-CM | POA: Diagnosis not present

## 2018-04-15 DIAGNOSIS — M79674 Pain in right toe(s): Secondary | ICD-10-CM | POA: Diagnosis not present

## 2018-04-15 DIAGNOSIS — L03031 Cellulitis of right toe: Secondary | ICD-10-CM | POA: Diagnosis not present

## 2018-05-31 ENCOUNTER — Ambulatory Visit (INDEPENDENT_AMBULATORY_CARE_PROVIDER_SITE_OTHER): Payer: Commercial Managed Care - PPO | Admitting: Nurse Practitioner

## 2018-05-31 ENCOUNTER — Encounter: Payer: Self-pay | Admitting: Nurse Practitioner

## 2018-05-31 ENCOUNTER — Other Ambulatory Visit: Payer: Self-pay

## 2018-05-31 VITALS — BP 132/78 | HR 98 | Temp 99.8°F | Resp 14 | Ht 73.0 in | Wt 349.0 lb

## 2018-05-31 DIAGNOSIS — N181 Chronic kidney disease, stage 1: Secondary | ICD-10-CM

## 2018-05-31 DIAGNOSIS — Z13220 Encounter for screening for lipoid disorders: Secondary | ICD-10-CM

## 2018-05-31 DIAGNOSIS — I10 Essential (primary) hypertension: Secondary | ICD-10-CM

## 2018-05-31 DIAGNOSIS — H6123 Impacted cerumen, bilateral: Secondary | ICD-10-CM

## 2018-05-31 DIAGNOSIS — R6889 Other general symptoms and signs: Secondary | ICD-10-CM

## 2018-05-31 DIAGNOSIS — E0822 Diabetes mellitus due to underlying condition with diabetic chronic kidney disease: Secondary | ICD-10-CM | POA: Diagnosis not present

## 2018-05-31 DIAGNOSIS — J101 Influenza due to other identified influenza virus with other respiratory manifestations: Secondary | ICD-10-CM | POA: Diagnosis not present

## 2018-05-31 LAB — POCT INFLUENZA A/B
Influenza A, POC: POSITIVE — AB
Influenza B, POC: NEGATIVE

## 2018-05-31 NOTE — Patient Instructions (Addendum)
- Please stay home and rest and drink plenty of fluids so you can feel better. Based on your symptoms some of the following medicines can make you feel better:  For Fever/Pain: Acetaminophen every 6 hours as needed (maximum of 3000mg  a day). If you are still uncomfortable you can add ibuprofen OR naproxen  For coughing: try dextromethorphan for a cough suppressant, and/or a cool mist humidifier, lozenges  For sore throat: saline gargles, honey herbal tea, lozenges, throat spray  To dry out your nose: try an antihistamine like loratadine (non-sedating) or diphenhydramine (sedating) or others To relieve a stuffy nose: try flonase, or a  neti pot To make blowing your nose easier and relieve chest congestion: guaifenesin 400mg  every 4-6 hours of guaifenesin ER [phone] mg every 12 hours. Do not take more than 2,400mg  a day.   Some time in the next 2 weeks please go to the medical mall at [facility] to get your blood and urine tested, order have been placed.   Influenza, Adult Influenza, more commonly known as "the flu," is a viral infection that mainly affects the respiratory tract. The respiratory tract includes organs that help you breathe, such as the lungs, nose, and throat. The flu causes many symptoms similar to the common cold along with high fever and body aches. The flu spreads easily from person to person (is contagious). Getting a flu shot (influenza vaccination) every year is the best way to prevent the flu. What are the causes? This condition is caused by the influenza virus. You can get the virus by:  Breathing in droplets that are in the air from an infected person's cough or sneeze.  Touching something that has been exposed to the virus (has been contaminated) and then touching your mouth, nose, or eyes. What increases the risk? The following factors may make you more likely to get the flu:  Not washing or sanitizing your hands often.  Having close contact with many people during cold  and flu season.  Touching your mouth, eyes, or nose without first washing or sanitizing your hands.  Not getting a yearly (annual) flu shot. You may have a higher risk for the flu, including serious problems such as a lung infection (pneumonia), if you:  Are older than 65.  Are pregnant.  Have a weakened disease-fighting system (immune system). You may have a weakened immune system if you: ? Have HIV or AIDS. ? Are undergoing chemotherapy. ? Are taking medicines that reduce (suppress) the activity of your immune system.  Have a long-term (chronic) illness, such as heart disease, kidney disease, diabetes, or lung disease.  Have a liver disorder.  Are severely overweight (morbidly obese).  Have anemia. This is a condition that affects your red blood cells.  Have asthma. What are the signs or symptoms? Symptoms of this condition usually begin suddenly and last 4-14 days. They may include:  Fever and chills.  Headaches, body aches, or muscle aches.  Sore throat.  Cough.  Runny or stuffy (congested) nose.  Chest discomfort.  Poor appetite.  Weakness or fatigue.  Dizziness.  Nausea or vomiting. How is this diagnosed? This condition may be diagnosed based on:  Your symptoms and medical history.  A physical exam.  Swabbing your nose or throat and testing the fluid for the influenza virus. How is this treated? If the flu is diagnosed early, you can be treated with medicine that can help reduce how severe the illness is and how long it lasts (antiviral medicine). This  may be given by mouth (orally) or through an IV. Taking care of yourself at home can help relieve symptoms. Your health care provider may recommend:  Taking over-the-counter medicines.  Drinking plenty of fluids. In many cases, the flu goes away on its own. If you have severe symptoms or complications, you may be treated in a hospital. Follow these instructions at home: Activity  Rest as needed  and get plenty of sleep.  Stay home from work or school as told by your health care provider. Unless you are visiting your health care provider, avoid leaving home until your fever has been gone for 24 hours without taking medicine. Eating and drinking  Take an oral rehydration solution (ORS). This is a drink that is sold at pharmacies and retail stores.  Drink enough fluid to keep your urine pale yellow.  Drink clear fluids in small amounts as you are able. Clear fluids include water, ice chips, diluted fruit juice, and low-calorie sports drinks.  Eat bland, easy-to-digest foods in small amounts as you are able. These foods include bananas, applesauce, rice, lean meats, toast, and crackers.  Avoid drinking fluids that contain a lot of sugar or caffeine, such as energy drinks, regular sports drinks, and soda.  Avoid alcohol.  Avoid spicy or fatty foods. General instructions      Take over-the-counter and prescription medicines only as told by your health care provider.  Use a cool mist humidifier to add humidity to the air in your home. This can make it easier to breathe.  Cover your mouth and nose when you cough or sneeze.  Wash your hands with soap and water often, especially after you cough or sneeze. If soap and water are not available, use alcohol-based hand sanitizer.  Keep all follow-up visits as told by your health care provider. This is important. How is this prevented?  Get an annual flu shot. You may get the flu shot in late summer, fall, or winter. Ask your health care provider when you should get your flu shot.  Avoid contact with people who are sick during cold and flu season. This is generally fall and winter. Contact a health care provider if:  You develop new symptoms.  You have: ? Chest pain. ? Diarrhea. ? A fever.  Your cough gets worse.  You produce more mucus.  You feel nauseous or you vomit. Get help right away if:  You develop shortness of  breath or difficulty breathing.  Your skin or nails turn a bluish color.  You have severe pain or stiffness in your neck.  You develop a sudden headache or sudden pain in your face or ear.  You cannot eat or drink without vomiting. Summary  Influenza, more commonly known as "the flu," is a viral infection that primarily affects your respiratory tract.  Symptoms of the flu usually begin suddenly and last 4-14 days.  Getting an annual flu shot is the best way to prevent getting the flu.  Stay home from work or school as told by your health care provider. Unless you are visiting your health care provider, avoid leaving home until your fever has been gone for 24 hours without taking medicine.  Keep all follow-up visits as told by your health care provider. This is important. This information is not intended to replace advice given to you by your health care provider. Make sure you discuss any questions you have with your health care provider. Document Released: 03/03/2000 Document Revised: 08/22/2017 Document Reviewed: 08/22/2017 Elsevier  Interactive Patient Education  [employer].

## 2018-05-31 NOTE — Progress Notes (Signed)
Name: Walter Parks   MRN: [id number]    DOB: 01-07-1986   Date:05/31/2018       Progress Note  Subjective  Chief Complaint  Chief Complaint  Patient presents with  . URI    patient presents with sx for the past 2 days - sudafed  . Cough    dry.   . Fever    OTC: Tylenol  . Generalized Body Aches    achy joints   . Dizziness    when he stands up    HPI  Influenza Patient endorses cough, congestion, generalized body aches, subjective fevers and chills and postural dizziness for a couple of seconds for the past 2 days. States worst symptom is the headache.  Denies shortness of breath, nausea, vomiting, chest pain.   Hypertension Patient was prescribed amlodopine 10mg  and lisinopril 10mg ; last office visit was 05/31/2016 states has stopped taking it for over a year  BP Readings from Last 3 Encounters:  05/31/18 132/78  02/02/17 (!) 150/83  01/11/17 (!) 161/95    Diabetes Patient was taking metformin 500mg  twice daily with meal but has been off for over a year.   Lab Results  Component Value Date   HGBA1C 5.7 (H) 11/29/2015    Morbid Obesity Had weight loss surgery in 2017- states has followed up with them yearly. Patient is eating much better now, eating high protein and lots of vegetables. Patient exercises 2-3 times a week for about an hour Wt Readings from Last 3 Encounters:  05/31/18 (!) 349 lb (158.3 kg)  02/02/17 (!) 344 lb 9.6 oz (156.3 kg)  01/11/17 (!) 353 lb 6.4 oz (160.3 kg)  05/31/2016 - 378lbs    Patient Active Problem List   Diagnosis Date Noted  . Diabetes mellitus due to underlying condition with stage 1 chronic kidney disease, without long-term current use of insulin (HCC) 06/25/2015  . Hx of tobacco use, presenting hazards to health 06/25/2015  . OSA (obstructive sleep apnea) 06/25/2015  . Medication monitoring encounter 04/19/2015  . Type 2 diabetes mellitus (HCC) 12/16/2014  . Essential hypertension, benign 12/16/2014  . Morbid obesity (HCC)  12/16/2014  . Sleep apnea 12/16/2014  . Acid reflux 12/16/2014    Past Medical History:  Diagnosis Date  . Depression   . Diabetes mellitus without complication (HCC)   . GERD (gastroesophageal reflux disease)   . Hx of tobacco use, presenting hazards to health 06/25/2015  . Hypertension   . Obesity   . Sleep apnea    on an APAP machine    Past Surgical History:  Procedure Laterality Date  . LAPAROSCOPIC GASTRIC RESTRICTIVE DUODENAL PROCEDURE (DUODENAL SWITCH) N/A 11/02/2015   Procedure: LAPAROSCOPIC GASTRIC RESTRICTIVE DUODENAL PROCEDURE (DUODENAL SWITCH);  Surgeon: Everette Rank, MD;  Location: [facility] ORS;  Service: General;  Laterality: N/A;  . TONSILLECTOMY    . TONSILLECTOMY      Social History   Tobacco Use  . Smoking status: Current Some Day Smoker    Packs/day: 0.00    Years: 18.00    Pack years: 0.00    Types: Cigarettes    Last attempt to quit: 08/01/2015    Years since quitting: 2.8  . Smokeless tobacco: Never Used  Substance Use Topics  . Alcohol use: Yes    Alcohol/week: 0.0 standard drinks    Comment: socially     Current Outpatient Medications:  Marland Kitchen  Multiple Vitamins-Minerals (MULTIVITAMIN WITH MINERALS) tablet, Take 1 tablet by mouth daily., Disp: , Rfl:  .  amLODipine (NORVASC) 10 MG tablet, TAKE 1 TABLET (10 MG TOTAL) BY MOUTH DAILY. (Patient not taking: Reported on 05/31/2018), Disp: 90 tablet, Rfl: 2 .  calcium gluconate 500 MG tablet, Take 1 tablet by mouth daily., Disp: , Rfl:  .  diazepam (VALIUM) 10 MG tablet, , Disp: , Rfl:  .  lisinopril (PRINIVIL,ZESTRIL) 10 MG tablet, TAKE 1.5 TABLETS (15 MG TOTAL) BY MOUTH DAILY. (Patient not taking: Reported on 05/31/2018), Disp: 15 tablet, Rfl: 0 .  metFORMIN (GLUCOPHAGE) 500 MG tablet, Take 1 tablet (500 mg total) by mouth 2 (two) times daily with a meal. (Patient not taking: Reported on 05/31/2018), Disp: 180 tablet, Rfl: 3 .  oxyCODONE-acetaminophen (ROXICET) 5-325 MG tablet, Take 1 tablet every 4 (four)  hours as needed by mouth for severe pain. (Patient not taking: Reported on 05/31/2018), Disp: 30 tablet, Rfl: 0  Allergies  Allergen Reactions  . Lemon Flavor Hives    ROS    No other specific complaints in a complete review of systems (except as listed in HPI above).  Objective  Vitals:   05/31/18 1039  BP: 132/78  Pulse: 98  Resp: 14  Temp: 99.8 F (37.7 C)  TempSrc: Oral  SpO2: 98%  Weight: (!) 349 lb (158.3 kg)  Height: 6\' 1"  (1.854 m)     Body mass index is 46.04 kg/m.  Nursing Note and Vital Signs reviewed.  Physical Exam HENT:     Head: Normocephalic and atraumatic.     Right Ear: No swelling or tenderness. A middle ear effusion is present. There is impacted cerumen. Tympanic membrane is not injected.     Left Ear: No swelling or tenderness.  No middle ear effusion. There is impacted cerumen. Tympanic membrane is not injected.     Ears:     Comments: After irrigation noted some fluid, no signs of infection     Nose:     Right Sinus: Frontal sinus tenderness present. No maxillary sinus tenderness.     Left Sinus: Frontal sinus tenderness present. No maxillary sinus tenderness.     Mouth/Throat:     Mouth: Mucous membranes are moist.     Pharynx: Uvula midline. No oropharyngeal exudate or posterior oropharyngeal erythema.  Eyes:     General:        Right eye: No discharge.        Left eye: No discharge.     Extraocular Movements: Extraocular movements intact.     Conjunctiva/sclera: Conjunctivae normal.     Pupils: Pupils are equal, round, and reactive to light.  Neck:     Musculoskeletal: Normal range of motion.  Cardiovascular:     Rate and Rhythm: Normal rate.  Pulmonary:     Effort: Pulmonary effort is normal.     Breath sounds: Normal breath sounds.  Musculoskeletal: Normal range of motion.  Lymphadenopathy:     Cervical: No cervical adenopathy.  Skin:    General: Skin is warm and dry.     Findings: No rash.  Neurological:     General: No  focal deficit present.     Mental Status: He is alert and oriented to person, place, and time.  Psychiatric:        Judgment: Judgment normal.       Diabetic Foot Exam - Simple   Simple Foot Form Diabetic Foot exam was performed with the following findings:  Yes 05/31/2018 11:21 AM  Visual Inspection No deformities, no ulcerations, no other skin breakdown bilaterally:  Yes Sensation Testing  Intact to touch and monofilament testing bilaterally:  Yes Pulse Check Posterior Tibialis and Dorsalis pulse intact bilaterally:  Yes Comments Dry skin, and bilateral big toe nail removed, healing appropriately,     Results for orders placed or performed in visit on 05/31/18 (from the past 48 hour(s))  POCT Influenza A/B     Status: Abnormal   Collection Time: 05/31/18 10:48 AM  Result Value Ref Range   Influenza A, POC Positive (A) Negative   Influenza B, POC Negative Negative    Assessment & Plan  1. Flu-like symptoms - POCT Influenza A/B  2. Influenza A Discussed OTC managment  3. Diabetes mellitus due to underlying condition with stage 1 chronic kidney disease, without long-term current use of insulin (HCC) Will get labs done at hospital in 1-2 weeks  - Comprehensive Metabolic Panel (CMET); Future - Lipid Profile; Future - HgB A1c; Future - Urine Microalbumin w/creat. ratio; Future  4. Morbid obesity (HCC) Continue with diet and exercise  - Comprehensive Metabolic Panel (CMET); Future - Lipid Profile; Future  5. Essential hypertension, benign Well controlled presently  - Comprehensive Metabolic Panel (CMET); Future - Lipid Profile; Future  6. Screening for lipid disorders  - Lipid Profile; Future  7. Bilateral impacted cerumen Off balance feeling resolved when cleared.  - Ear Lavage

## 2018-06-07 ENCOUNTER — Encounter: Payer: Self-pay | Admitting: Family Medicine

## 2018-06-14 ENCOUNTER — Encounter: Payer: Commercial Managed Care - PPO | Admitting: Family Medicine

## 2018-09-12 ENCOUNTER — Encounter: Payer: Commercial Managed Care - PPO | Admitting: Family Medicine

## 2018-12-27 ENCOUNTER — Ambulatory Visit: Payer: Commercial Managed Care - PPO | Admitting: Family Medicine

## 2019-02-04 NOTE — Progress Notes (Signed)
02/05/2019 6:41 PM   Walter Parks [birth year] [id number]  Referring provider: Kerman Parks, Walter P, MD [address] [place] [id number]  Chief Complaint  Patient presents with  . Testicle Pain    HPI: Mr. Walter Parks is a 33 year old male who presents today for swollen testicles.    He had a vasectomy with Dr. Sherryl BartersBudzyn in 2018.    He states he has noticed that his left scrotum has been swollen and painful.  He describes the pain as a tugging sensation.  He is also experiencing scrotal pain during intercourse.  He denied any injury to the testicle.  He denied any penile discharge.  Patient denies any gross hematuria, dysuria or suprapubic/flank pain.  Patient denies any fevers, chills, nausea or vomiting.   PMH: Past Medical History:  Diagnosis Date  . Depression   . Diabetes mellitus without complication (HCC)   . GERD (gastroesophageal reflux disease)   . Hx of tobacco use, presenting hazards to health 06/25/2015  . Hypertension   . Obesity   . Sleep apnea    on an APAP machine    Surgical History: Past Surgical History:  Procedure Laterality Date  . LAPAROSCOPIC GASTRIC RESTRICTIVE DUODENAL PROCEDURE (DUODENAL SWITCH) N/A 11/02/2015   Procedure: LAPAROSCOPIC GASTRIC RESTRICTIVE DUODENAL PROCEDURE (DUODENAL SWITCH);  Surgeon: Everette RankMichael A Tyner, MD;  Location: [facility] ORS;  Service: General;  Laterality: N/A;  . TONSILLECTOMY    . TONSILLECTOMY      Home Medications:  Allergies as of 02/05/2019      Reactions   Lemon Flavor Hives      Medication List       Accurate as of February 05, 2019 11:59 PM. If you have any questions, ask your nurse or doctor.        calcium gluconate 500 MG tablet Take 1 tablet by mouth daily.   diazepam 10 MG tablet Commonly known as: VALIUM   multivitamin with minerals tablet Take 1 tablet by mouth daily.   sulfamethoxazole-trimethoprim 800-160 MG tablet Commonly known as: BACTRIM DS Take 1 tablet by mouth every 12 (twelve)  hours. Started by: Walter CowboySHANNON Maie Kesinger, PA-C       Allergies:  Allergies  Allergen Reactions  . Lemon Flavor Hives    Family History: Family History  Problem Relation Age of Onset  . Hypertension Mother   . Hypertension Father   . Diabetes Maternal Aunt   . Hypertension Maternal Grandmother   . Hypertension Maternal Grandfather   . Stroke Paternal Grandmother   . Hypertension Paternal Grandmother   . Hypertension Paternal Grandfather   . Diabetes Cousin   . Heart disease Neg Hx   . COPD Neg Hx     Social History:  reports that he has been smoking cigarettes. He has been smoking about 0.00 packs per day for the past 18.00 years. He has never used smokeless tobacco. He reports current alcohol use. He reports that he does not use drugs.  ROS: UROLOGY Frequent Urination?: No Hard to postpone urination?: No Burning/pain with urination?: No Get up at night to urinate?: No Leakage of urine?: No Urine stream starts and stops?: No Trouble starting stream?: No Do you have to strain to urinate?: No Blood in urine?: No Urinary tract infection?: No Sexually transmitted disease?: No Injury to kidneys or bladder?: No Painful intercourse?: Yes Weak stream?: No Erection problems?: No Penile pain?: No  Gastrointestinal Nausea?: No Vomiting?: No Indigestion/heartburn?: No Diarrhea?: No Constipation?: No  Constitutional  Fever: No Night sweats?: No Weight loss?: No Fatigue?: No  Skin Skin rash/lesions?: No Itching?: No  Eyes Blurred vision?: No Double vision?: No  Ears/Nose/Throat Sore throat?: No Sinus problems?: No  Hematologic/Lymphatic Swollen glands?: No Easy bruising?: No  Cardiovascular Leg swelling?: No Chest pain?: No  Respiratory Cough?: No Shortness of breath?: No  Endocrine Excessive thirst?: No  Musculoskeletal Back pain?: No Joint pain?: No  Neurological Headaches?: No Dizziness?: No  Psychologic Depression?: No Anxiety?: No   Physical Exam: BP (!) 153/95   Pulse 71   Ht 6\' 1"  (1.854 m)   Wt (!) 369 lb 4.8 oz (167.5 kg)   BMI 48.72 kg/m   Constitutional:  Well nourished. Alert and oriented, No acute distress. HEENT: [place] AT, moist mucus membranes.  Trachea midline, no masses. Cardiovascular: No clubbing, cyanosis, or edema. Respiratory: Normal respiratory effort, no increased work of breathing. GI: Abdomen is soft, non tender, non distended, no abdominal masses. Liver and spleen not palpable.  No hernias appreciated.  Stool sample for occult testing is not indicated.   GU: No CVA tenderness.  No bladder fullness or masses.  Patient with uncircumcised phallus.  Foreskin easily retracted Urethral meatus is patent.  No penile discharge. No penile lesions or rashes. Scrotum without lesions, cysts, rashes and/or edema.  Testicles are located scrotally bilaterally. No masses are appreciated in the testicles. Right epididymis is normal.  Left epididymis is tender and indurated.  Left hydrocele.  Rectal: Not indicated. Skin: No rashes, bruises or suspicious lesions. Lymph: No inguinal adenopathy. Neurologic: Grossly intact, no focal deficits, moving all 4 extremities. Psychiatric: Normal mood and affect.  Laboratory Data: Lab Results  Component Value Date   WBC 14.8 (H) 11/03/2015   HGB 15.1 11/03/2015   HCT 47.1 11/03/2015   MCV 79.5 (L) 11/03/2015   PLT 303 11/03/2015    Lab Results  Component Value Date   CREATININE 0.92 11/29/2015    No results found for: PSA  No results found for: TESTOSTERONE  Lab Results  Component Value Date   HGBA1C 5.7 (H) 11/29/2015    Lab Results  Component Value Date   TSH 1.940 04/19/2015       Component Value Date/Time   CHOL 130 11/29/2015 1107   CHOL 171 04/19/2015 0938   HDL 26 (L) 11/29/2015 1107   HDL 34 (L) 04/19/2015 0938   CHOLHDL 5.0 11/29/2015 1107   VLDL 22 11/29/2015 1107   LDLCALC 82 11/29/2015 1107   LDLCALC 114 (H) 04/19/2015 0938    Lab  Results  Component Value Date   AST 30 11/29/2015   Lab Results  Component Value Date   ALT 38 11/29/2015   No components found for: ALKALINEPHOPHATASE No components found for: BILIRUBINTOTAL  No results found for: ESTRADIOL  Urinalysis No results found for: COLORURINE, APPEARANCEUR, LABSPEC, PHURINE, GLUCOSEU, HGBUR, BILIRUBINUR, KETONESUR, PROTEINUR, UROBILINOGEN, NITRITE, LEUKOCYTESUR  I have reviewed the labs.   Pertinent Imaging: CLINICAL DATA:  Left testicular mass and pain.  EXAM: SCROTAL ULTRASOUND  DOPPLER ULTRASOUND OF THE TESTICLES  TECHNIQUE: Complete ultrasound examination of the testicles, epididymis, and other scrotal structures was performed. Color and spectral Doppler ultrasound were also utilized to evaluate blood flow to the testicles.  COMPARISON:  None.  FINDINGS: Right testicle  Measurements: 3.8 x 1.7 x 2.2 cm. No mass or microlithiasis visualized.  Left testicle  Measurements: 5.4 x 3.0 x 3.4 cm. No mass or microlithiasis visualized.  Right epididymis:  Normal in size and appearance.  Left epididymis:  5 mm cyst on the left epididymis.  Hydrocele:  Small left hydrocele.  Small left varicocele.  Varicocele:  None visualized.  Pulsed Doppler interrogation of both testes demonstrates normal low resistance arterial and venous waveforms bilaterally.  Incidental note is made of multiple small bilateral inguinal lymph nodes with fatty hila, likely reactive.  IMPRESSION: Asymmetric size of the testicles. However, there is no evidence of the left testicular mass or other significant abnormality of the left testicle.  Minimal left hydrocele.  Minimal left varicocele.   Electronically Signed   By: Francene Boyers M.D.   On: 02/05/2019 17:25  I have independently reviewed the films, patient states that his left testicle has been asymmetrical at baseline.   Assessment & Plan:    1. Left epididymitis Start Septra  DS, BID x two weeks Obtain scrotal ultrasound for confirmation  2. Left hydrocele Probably reactive Scrotal ultrasound pending  3. Post vasectomy Patient has not provided post vasectomy specimen  Return for report scrotal ultrasound .  These notes generated with voice recognition software. I apologize for typographical errors.  Walter Cowboy, PA-C  [facility] [address]  [address] [place], [place] [address] [phone]

## 2019-02-05 ENCOUNTER — Other Ambulatory Visit: Payer: Self-pay

## 2019-02-05 ENCOUNTER — Ambulatory Visit: Payer: Commercial Managed Care - PPO | Admitting: Urology

## 2019-02-05 ENCOUNTER — Encounter: Payer: Self-pay | Admitting: Urology

## 2019-02-05 ENCOUNTER — Ambulatory Visit
Admission: RE | Admit: 2019-02-05 | Discharge: 2019-02-05 | Disposition: A | Discharge status: Home / Self Care | Payer: Commercial Managed Care - PPO | Source: Ambulatory Visit | Attending: Urology | Admitting: Urology

## 2019-02-05 VITALS — BP 153/95 | HR 71 | Ht 73.0 in | Wt 369.3 lb

## 2019-02-05 DIAGNOSIS — N432 Other hydrocele: Secondary | ICD-10-CM

## 2019-02-05 DIAGNOSIS — N5089 Other specified disorders of the male genital organs: Secondary | ICD-10-CM | POA: Diagnosis present

## 2019-02-05 DIAGNOSIS — N50812 Left testicular pain: Secondary | ICD-10-CM | POA: Diagnosis not present

## 2019-02-05 MED ORDER — SULFAMETHOXAZOLE-TRIMETHOPRIM 800-160 MG PO TABS: 1.0000 | ORAL_TABLET | Freq: Two times a day (BID) | ORAL | 0 refills | Status: DC

## 2019-02-07 ENCOUNTER — Ambulatory Visit: Payer: Commercial Managed Care - PPO | Admitting: Family Medicine

## 2019-02-15 ENCOUNTER — Telehealth: Payer: Self-pay | Admitting: Urology

## 2019-02-15 NOTE — Telephone Encounter (Signed)
Please let Walter Parks know that he needs a follow up visit for his scrotal swelling.

## 2019-02-17 NOTE — Telephone Encounter (Signed)
Patient notified and appointment made

## 2019-03-17 NOTE — Progress Notes (Signed)
03/18/2019 11:54 AM   [name] 01-24-1986 [id number]  Referring provider: Kerman Passey, MD [address] [place],  [place] [address]  Chief Complaint  Patient presents with  . Hydrocele    HPI: Mr. Walter Parks is a 33 year old male who presents today for follow up for swollen testicles.   He had a vasectomy with Dr. Sherryl Barters in 2018.  At his visit on 02/05/2019, he stated he has noticed that his left scrotum has been swollen and painful.  He described the pain as a tugging sensation.  He was also experiencing scrotal pain during intercourse.  He denied any injury to the testicle.  He denied any penile discharge.  Patient denies any gross hematuria, dysuria or suprapubic/flank pain.  Patient denies any fevers, chills, nausea or vomiting.   Scrotal ultrasound 02/05/2019 asymmetric size of the testicles. However, there is no evidence of the left testicular mass or other significant abnormality of the left testicle.  Minimal left hydrocele. Minimal left varicocele.  He was placed on Septra DS BID x two weeks.    Today, he states his symptoms have resolved and he feels great.  He no longer is experiencing left testicular swelling or pain.  He states the tugging sensation has abated.  And he is no longer having pain with intercourse.  Patient denies any gross hematuria, dysuria or suprapubic/flank pain.  Patient denies any fevers, chills, nausea or vomiting.   PMH: Past Medical History:  Diagnosis Date  . Depression   . Diabetes mellitus without complication (HCC)   . GERD (gastroesophageal reflux disease)   . Hx of tobacco use, presenting hazards to health 06/25/2015  . Hypertension   . Obesity   . Sleep apnea    on an APAP machine    Surgical History: Past Surgical History:  Procedure Laterality Date  . LAPAROSCOPIC GASTRIC RESTRICTIVE DUODENAL PROCEDURE (DUODENAL SWITCH) N/A 11/02/2015   Procedure: LAPAROSCOPIC GASTRIC RESTRICTIVE DUODENAL PROCEDURE (DUODENAL SWITCH);   Surgeon: Everette Rank, MD;  Location: [facility] ORS;  Service: General;  Laterality: N/A;  . TONSILLECTOMY    . TONSILLECTOMY      Home Medications:  Allergies as of 03/18/2019      Reactions   Lemon Flavor Hives      Medication List       Accurate as of March 18, 2019 11:54 AM. If you have any questions, ask your nurse or doctor.        calcium gluconate 500 MG tablet Take 1 tablet by mouth daily.   diazepam 10 MG tablet Commonly known as: VALIUM   multivitamin with minerals tablet Take 1 tablet by mouth daily.   sulfamethoxazole-trimethoprim 800-160 MG tablet Commonly known as: BACTRIM DS Take 1 tablet by mouth every 12 (twelve) hours.       Allergies:  Allergies  Allergen Reactions  . Lemon Flavor Hives    Family History: Family History  Problem Relation Age of Onset  . Hypertension Mother   . Hypertension Father   . Diabetes Maternal Aunt   . Hypertension Maternal Grandmother   . Hypertension Maternal Grandfather   . Stroke Paternal Grandmother   . Hypertension Paternal Grandmother   . Hypertension Paternal Grandfather   . Diabetes Cousin   . Heart disease Neg Hx   . COPD Neg Hx     Social History:  reports that he has been smoking cigarettes. He has been smoking about 0.00 packs per day for the past 18.00 years.  He has never used smokeless tobacco. He reports current alcohol use. He reports that he does not use drugs.  ROS: UROLOGY Frequent Urination?: No Hard to postpone urination?: No Burning/pain with urination?: No Get up at night to urinate?: No Leakage of urine?: No Urine stream starts and stops?: No Trouble starting stream?: No Do you have to strain to urinate?: No Blood in urine?: No Urinary tract infection?: No Sexually transmitted disease?: No Injury to kidneys or bladder?: No Painful intercourse?: No Weak stream?: No Erection problems?: No Penile pain?: No  Gastrointestinal Nausea?: No Vomiting?:  No Indigestion/heartburn?: No Diarrhea?: No Constipation?: No  Constitutional Fever: No Night sweats?: No Weight loss?: No Fatigue?: No  Skin Skin rash/lesions?: No Itching?: No  Eyes Blurred vision?: No Double vision?: No  Ears/Nose/Throat Sore throat?: No Sinus problems?: No  Hematologic/Lymphatic Swollen glands?: No Easy bruising?: No  Cardiovascular Leg swelling?: No Chest pain?: No  Respiratory Cough?: No Shortness of breath?: No  Endocrine Excessive thirst?: No  Musculoskeletal Back pain?: No Joint pain?: No  Neurological Headaches?: No Dizziness?: No  Psychologic Depression?: No Anxiety?: No  Physical Exam: BP (!) 146/94   Pulse 76   Ht 6\' 1"  (1.854 m)   Wt (!) 369 lb (167.4 kg)   BMI 48.68 kg/m   Constitutional:  Well nourished. Alert and oriented, No acute distress. HEENT: [place] AT, moist mucus membranes.  Trachea midline, no masses. Cardiovascular: No clubbing, cyanosis, or edema. Respiratory: Normal respiratory effort, no increased work of breathing. Neurologic: Grossly intact, no focal deficits, moving all 4 extremities. Psychiatric: Normal mood and affect.   Laboratory Data: Lab Results  Component Value Date   WBC 14.8 (H) 11/03/2015   HGB 15.1 11/03/2015   HCT 47.1 11/03/2015   MCV 79.5 (L) 11/03/2015   PLT 303 11/03/2015    Lab Results  Component Value Date   CREATININE 0.92 11/29/2015    No results found for: PSA  No results found for: TESTOSTERONE  Lab Results  Component Value Date   HGBA1C 5.7 (H) 11/29/2015    Lab Results  Component Value Date   TSH 1.940 04/19/2015       Component Value Date/Time   CHOL 130 11/29/2015 1107   CHOL 171 04/19/2015 0938   HDL 26 (L) 11/29/2015 1107   HDL 34 (L) 04/19/2015 0938   CHOLHDL 5.0 11/29/2015 1107   VLDL 22 11/29/2015 1107   LDLCALC 82 11/29/2015 1107   LDLCALC 114 (H) 04/19/2015 0938    Lab Results  Component Value Date   AST 30 11/29/2015   Lab  Results  Component Value Date   ALT 38 11/29/2015   No components found for: ALKALINEPHOPHATASE No components found for: BILIRUBINTOTAL  No results found for: ESTRADIOL  Urinalysis No results found for: COLORURINE, APPEARANCEUR, LABSPEC, PHURINE, GLUCOSEU, HGBUR, BILIRUBINUR, KETONESUR, PROTEINUR, UROBILINOGEN, NITRITE, LEUKOCYTESUR  I have reviewed the labs.   Pertinent Imaging: CLINICAL DATA:  Left testicular mass and pain.  EXAM: SCROTAL ULTRASOUND  DOPPLER ULTRASOUND OF THE TESTICLES  TECHNIQUE: Complete ultrasound examination of the testicles, epididymis, and other scrotal structures was performed. Color and spectral Doppler ultrasound were also utilized to evaluate blood flow to the testicles.  COMPARISON:  None.  FINDINGS: Right testicle  Measurements: 3.8 x 1.7 x 2.2 cm. No mass or microlithiasis visualized.  Left testicle  Measurements: 5.4 x 3.0 x 3.4 cm. No mass or microlithiasis visualized.  Right epididymis:  Normal in size and appearance.  Left epididymis:  5 mm cyst  on the left epididymis.  Hydrocele:  Small left hydrocele.  Small left varicocele.  Varicocele:  None visualized.  Pulsed Doppler interrogation of both testes demonstrates normal low resistance arterial and venous waveforms bilaterally.  Incidental note is made of multiple small bilateral inguinal lymph nodes with fatty hila, likely reactive.  IMPRESSION: Asymmetric size of the testicles. However, there is no evidence of the left testicular mass or other significant abnormality of the left testicle.  Minimal left hydrocele.  Minimal left varicocele.   Electronically Signed   By: Lorriane Shire M.D.   On: 02/05/2019 17:25  Assessment & Plan:    1. Left epididymitis Resolved  2. Left hydrocele/left varicocele Continue to manage conservatively     Return if symptoms worsen or fail to improve.  These notes generated with voice recognition  software. I apologize for typographical errors.  Zara Council, PA-C  [facility] [address]  [place], [place] [address] [phone]

## 2019-03-18 ENCOUNTER — Ambulatory Visit: Payer: Commercial Managed Care - PPO | Admitting: Urology

## 2019-03-18 ENCOUNTER — Encounter: Payer: Self-pay | Admitting: Urology

## 2019-03-18 ENCOUNTER — Other Ambulatory Visit: Payer: Self-pay

## 2019-03-18 VITALS — BP 146/94 | HR 76 | Ht 73.0 in | Wt 369.0 lb

## 2019-03-18 DIAGNOSIS — N432 Other hydrocele: Secondary | ICD-10-CM

## 2019-03-18 DIAGNOSIS — I861 Scrotal varices: Secondary | ICD-10-CM

## 2019-04-11 ENCOUNTER — Other Ambulatory Visit: Payer: Self-pay

## 2019-04-11 ENCOUNTER — Emergency Department: Payer: Commercial Managed Care - PPO

## 2019-04-11 ENCOUNTER — Emergency Department
Admission: EM | Admit: 2019-04-11 | Discharge: 2019-04-11 | Disposition: A | Discharge status: Home / Self Care | Payer: Commercial Managed Care - PPO | Attending: Emergency Medicine | Admitting: Emergency Medicine

## 2019-04-11 ENCOUNTER — Encounter: Payer: Self-pay | Admitting: *Deleted

## 2019-04-11 DIAGNOSIS — Y92410 Unspecified street and highway as the place of occurrence of the external cause: Secondary | ICD-10-CM | POA: Insufficient documentation

## 2019-04-11 DIAGNOSIS — Y9389 Activity, other specified: Secondary | ICD-10-CM | POA: Diagnosis not present

## 2019-04-11 DIAGNOSIS — R1012 Left upper quadrant pain: Secondary | ICD-10-CM | POA: Diagnosis not present

## 2019-04-11 DIAGNOSIS — I10 Essential (primary) hypertension: Secondary | ICD-10-CM | POA: Insufficient documentation

## 2019-04-11 DIAGNOSIS — R519 Headache, unspecified: Secondary | ICD-10-CM | POA: Insufficient documentation

## 2019-04-11 DIAGNOSIS — E119 Type 2 diabetes mellitus without complications: Secondary | ICD-10-CM | POA: Insufficient documentation

## 2019-04-11 DIAGNOSIS — M545 Low back pain: Secondary | ICD-10-CM | POA: Insufficient documentation

## 2019-04-11 DIAGNOSIS — R4781 Slurred speech: Secondary | ICD-10-CM | POA: Insufficient documentation

## 2019-04-11 DIAGNOSIS — Z79899 Other long term (current) drug therapy: Secondary | ICD-10-CM | POA: Diagnosis not present

## 2019-04-11 DIAGNOSIS — F17210 Nicotine dependence, cigarettes, uncomplicated: Secondary | ICD-10-CM | POA: Insufficient documentation

## 2019-04-11 DIAGNOSIS — Y999 Unspecified external cause status: Secondary | ICD-10-CM | POA: Insufficient documentation

## 2019-04-11 LAB — CBC WITH DIFFERENTIAL/PLATELET
Abs Immature Granulocytes: 0.03 10*3/uL (ref 0.00–0.07)
Basophils Absolute: 0.1 10*3/uL (ref 0.0–0.1)
Basophils Relative: 0 %
Eosinophils Absolute: 0 10*3/uL (ref 0.0–0.5)
Eosinophils Relative: 0 %
HCT: 48.5 % (ref 39.0–52.0)
Hemoglobin: 15.8 g/dL (ref 13.0–17.0)
Immature Granulocytes: 0 %
Lymphocytes Relative: 38 %
Lymphs Abs: 4.6 10*3/uL — ABNORMAL HIGH (ref 0.7–4.0)
MCH: 27 pg (ref 26.0–34.0)
MCHC: 32.6 g/dL (ref 30.0–36.0)
MCV: 82.9 fL (ref 80.0–100.0)
Monocytes Absolute: 0.5 10*3/uL (ref 0.1–1.0)
Monocytes Relative: 4 %
Neutro Abs: 6.8 10*3/uL (ref 1.7–7.7)
Neutrophils Relative %: 58 %
Platelets: 266 10*3/uL (ref 150–400)
RBC: 5.85 MIL/uL — ABNORMAL HIGH (ref 4.22–5.81)
RDW: 14.6 % (ref 11.5–15.5)
WBC: 12 10*3/uL — ABNORMAL HIGH (ref 4.0–10.5)
nRBC: 0 % (ref 0.0–0.2)

## 2019-04-11 LAB — COMPREHENSIVE METABOLIC PANEL
ALT: 21 U/L (ref 0–44)
AST: 28 U/L (ref 15–41)
Albumin: 3.9 g/dL (ref 3.5–5.0)
Alkaline Phosphatase: 109 U/L (ref 38–126)
Anion gap: 9 (ref 5–15)
BUN: 12 mg/dL (ref 6–20)
CO2: 26 mmol/L (ref 22–32)
Calcium: 9 mg/dL (ref 8.9–10.3)
Chloride: 103 mmol/L (ref 98–111)
Creatinine, Ser: 0.87 mg/dL (ref 0.61–1.24)
GFR calc Af Amer: >60 mL/min (ref >60)
GFR calc non Af Amer: >60 mL/min (ref >60)
Glucose, Bld: 104 mg/dL — ABNORMAL HIGH (ref 70–99)
Potassium: 4 mmol/L (ref 3.5–5.1)
Sodium: 138 mmol/L (ref 135–145)
Total Bilirubin: 0.6 mg/dL (ref 0.3–1.2)
Total Protein: 7.1 g/dL (ref 6.5–8.1)

## 2019-04-11 MED ORDER — MELOXICAM 15 MG PO TABS: 15.0000 mg | ORAL_TABLET | Freq: Every day | ORAL | 1 refills | Status: AC

## 2019-04-11 MED ORDER — METHOCARBAMOL 500 MG PO TABS: 500.0000 mg | ORAL_TABLET | Freq: Three times a day (TID) | ORAL | 0 refills | Status: AC | PRN

## 2019-04-11 MED ORDER — IOHEXOL 300 MG/ML  SOLN
125.0000 mL | Freq: Once | INTRAMUSCULAR | Status: AC | PRN
  Administered 2019-04-11: 125 mL via INTRAVENOUS
  Filled 2019-04-11: qty 125

## 2019-04-11 MED ORDER — MORPHINE SULFATE (PF) 4 MG/ML IV SOLN
4.0000 mg | Freq: Once | INTRAVENOUS | Status: AC
  Administered 2019-04-11: 21:00:00 4 mg via INTRAVENOUS
  Filled 2019-04-11: qty 1
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
## 2020-02-02 ENCOUNTER — Ambulatory Visit: Payer: Self-pay | Admitting: *Deleted

## 2020-02-02 NOTE — Progress Notes (Signed)
Patient ID: MORLEY GAUMER, male    DOB: [birth year], 34 y.o.   MRN: [id number]  PCP: [facility]  Chief Complaint  Patient presents with  . Leg Pain    Subjective:   Walter Parks is a 34 y.o. male, presents to clinic with CC of the following:  Chief Complaint  Patient presents with  . Leg Pain    HPI:  Patient is a 34 y.o. male Last visit to [facility] was in March of 2020 Follows up today with leg pain  Patient noted that previously he had ruptured spider veins, that was many years ago.  He noted he was seen by [facility] and had some injections done to help. He states he picked up a heavy toolbox last week, and later in the day after and since, has been having pain in his left thigh area.  He feels the pain on the lateral side of the left thigh, in an area where the veins are more pronounced, and tender palpating this area.  He feels it from the mid thigh down towards the lateral knee area.  Also feels some pain anteriorly where the veins are more pronounced.  Denies any marked increased redness, no increased warmth, no fevers or shortness of breath.  He stated he thinks he is having the ruptured vein issue again.  He denied any history of DVT in his past.  He notes his blood pressure has been higher in the past, was on medicines at one point for high blood pressure, although not taking medicines presently for hypertension.  He can check his blood pressures at home, although has not been doing so.  Has a history of tobacco use  Patient Active Problem List   Diagnosis Date Noted  . Diabetes mellitus due to underlying condition with stage 1 chronic kidney disease, without long-term current use of insulin (HCC) 06/25/2015  . Hx of tobacco use, presenting hazards to health 06/25/2015  . OSA (obstructive sleep apnea) 06/25/2015  . Medication monitoring encounter 04/19/2015  . Type 2 diabetes mellitus (HCC) 12/16/2014  . Essential hypertension, benign  12/16/2014  . Morbid obesity (HCC) 12/16/2014  . Sleep apnea 12/16/2014  . Acid reflux 12/16/2014      Current Outpatient Medications:  .  calcium gluconate 500 MG tablet, Take 1 tablet by mouth daily., Disp: , Rfl:  .  diazepam (VALIUM) 10 MG tablet, , Disp: , Rfl:  .  fluticasone (FLONASE) 50 MCG/ACT nasal spray, Place into both nostrils., Disp: , Rfl:  .  Multiple Vitamins-Minerals (MULTIVITAMIN WITH MINERALS) tablet, Take 1 tablet by mouth daily., Disp: , Rfl:  .  sulfamethoxazole-trimethoprim (BACTRIM DS) 800-160 MG tablet, Take 1 tablet by mouth every 12 (twelve) hours., Disp: 28 tablet, Rfl: 0   Allergies  Allergen Reactions  . Lemon Flavor Hives     Past Surgical History:  Procedure Laterality Date  . LAPAROSCOPIC GASTRIC RESTRICTIVE DUODENAL PROCEDURE (DUODENAL SWITCH) N/A 11/02/2015   Procedure: LAPAROSCOPIC GASTRIC RESTRICTIVE DUODENAL PROCEDURE (DUODENAL SWITCH);  Surgeon: Everette Rank, MD;  Location: [facility] ORS;  Service: General;  Laterality: N/A;  . TONSILLECTOMY    . TONSILLECTOMY       Family History  Problem Relation Age of Onset  . Hypertension Mother   . Hypertension Father   . Diabetes Maternal Aunt   . Hypertension Maternal Grandmother   . Hypertension Maternal Grandfather   . Stroke Paternal Grandmother   . Hypertension Paternal Grandmother   .  Hypertension Paternal Grandfather   . Diabetes Cousin   . Heart disease Neg Hx   . COPD Neg Hx      Social History   Tobacco Use  . Smoking status: Current Some Day Smoker    Packs/day: 0.00    Years: 18.00    Pack years: 0.00    Types: Cigarettes    Last attempt to quit: 08/01/2015    Years since quitting: 4.5  . Smokeless tobacco: Never Used  Substance Use Topics  . Alcohol use: Yes    Alcohol/week: 0.0 standard drinks    Comment: socially    With staff assistance, above reviewed with the patient today.  ROS: As per HPI, otherwise no specific complaints on a limited and focused system  review   No results found for this or any previous visit (from the past 72 hour(s)).   PHQ2/9: Depression screen [facility] 2/9 02/03/2020 05/31/2018 05/31/2016 02/02/2016 11/29/2015  Decreased Interest 0 0 0 0 0  Down, Depressed, Hopeless 0 0 0 0 0  PHQ - 2 Score 0 0 0 0 0   PHQ-2/9 Result is   Fall Risk: Fall Risk  02/03/2020 05/31/2018 05/31/2016 02/02/2016 11/29/2015  Falls in the past year? 0 0 No No No  Number falls in past yr: 0 0 - - -  Injury with Fall? 0 0 - - -      Objective:   Vitals:   02/03/20 0854  BP: (!) 170/100  Pulse: (!) 108  Resp: 16  Temp: 98.9 F (37.2 C)  TempSrc: Oral  SpO2: 98%  Weight: (!) 410 lb 3.2 oz (186.1 kg)  Height: 6\' 1"  (1.854 m)    Body mass index is 54.12 kg/m. Recheck of the blood pressure by myself after resting in the office was 140/94 on the left. Physical Exam   NAD, masked, pleasant HEENT - [place]/AT, sclera anicteric,  Car - RRR, not tachycardic on my exam with his heart rate approximately 96 and regular. Pulm- RR and effort normal at rest,  Abd -obese Ext -pronounced varicosities noted in the left thigh region anteriorly and laterally, with marked tenderness noted palpating these regions.  The lateral varicosities are more distal towards the knee joint, and tenderness noted palpating more proximal as well to about mid thigh level.  Mild discomfort palpating the posterior thigh although not marked.  No increased warmth of these areas noted, no marked erythema.  No calf swelling or tenderness, no marked erythema or increased warmth of the calf. Sensation intact to light touch in the distal extremity, and he denies any numbness or tingling distally. Distal pulse in the left lower extremity adequate. No loss of strength of the foot, with adequate dorsi and plantar flexion. Neuro/psychiatric - affect was not flat, appropriate with conversation  Alert with normal speech   Results for orders placed or performed during the hospital encounter  of 04/11/19  CBC with Differential  Result Value Ref Range   WBC 12.0 (H) 4.0 - 10.5 K/uL   RBC 5.85 (H) 4.22 - 5.81 MIL/uL   Hemoglobin 15.8 13.0 - 17.0 g/dL   HCT 04/13/19 39 - 52 %   MCV 82.9 80.0 - 100.0 fL   MCH 27.0 26.0 - 34.0 pg   MCHC 32.6 30.0 - 36.0 g/dL   RDW 01.4 10.3 - 01.3 %   Platelets 266 150 - 400 K/uL   nRBC 0.0 0.0 - 0.2 %   Neutrophils Relative % 58 %   Neutro Abs 6.8  1.7 - 7.7 K/uL   Lymphocytes Relative 38 %   Lymphs Abs 4.6 (H) 0.7 - 4.0 K/uL   Monocytes Relative 4 %   Monocytes Absolute 0.5 0.1 - 1.0 K/uL   Eosinophils Relative 0 %   Eosinophils Absolute 0.0 0.0 - 0.5 K/uL   Basophils Relative 0 %   Basophils Absolute 0.1 0.0 - 0.1 K/uL   Immature Granulocytes 0 %   Abs Immature Granulocytes 0.03 0.00 - 0.07 K/uL  Comprehensive metabolic panel  Result Value Ref Range   Sodium 138 135 - 145 mmol/L   Potassium 4.0 3.5 - 5.1 mmol/L   Chloride 103 98 - 111 mmol/L   CO2 26 22 - 32 mmol/L   Glucose, Bld 104 (H) 70 - 99 mg/dL   BUN 12 6 - 20 mg/dL   Creatinine, Ser 0.97 0.61 - 1.24 mg/dL   Calcium 9.0 8.9 - 35.3 mg/dL   Total Protein 7.1 6.5 - 8.1 g/dL   Albumin 3.9 3.5 - 5.0 g/dL   AST 28 15 - 41 U/L   ALT 21 0 - 44 U/L   Alkaline Phosphatase 109 38 - 126 U/L   Total Bilirubin 0.6 0.3 - 1.2 mg/dL   GFR calc non Af Amer >60 >60 mL/min   GFR calc Af Amer >60 >60 mL/min   Anion gap 9 5 - 15       Assessment & Plan:    1. Pain of left lower extremity 2. Phlebitis and thrombophlebitis Discussed with patient this is likely a more superficial thrombophlebitis, although do feel getting an ultrasound to rule out any deep venous component is appropriate, and an ultrasound was ordered.  Ordered that urgently, and would like to get today as we discussed. Also a referral to [facility] provided, as he has seen them previously and they did do injection therapy previously. Recommended warm compresses, keeping the leg elevated, and can use natural  strength Tylenol product in the short-term to help with discomfort.  - US Venous Img Lower Unilateral Left (DVT); Future - Ambulatory referral to Vascular Surgery  3. Morbid obesity (HCC) A likely contributor to the above, - US Venous Img Lower Unilateral Left (DVT); Future - Ambulatory referral to Vascular Surgery  4. Elevated BP without diagnosis of hypertension He has been on medicines in the past for hypertension, although not in the more recent past. Did note that the increased pain can raise blood pressures, and may be a contributor. Felt best to not add on blood pressure medicine presently, and follow-up scheduled in 3 to 4 weeks time to help reassess. Noted as his pain improves, to check some blood pressures at home, write them down, and bring them on that follow-up visit.  A blood pressure medicine may need to be added if it is remaining elevated as noted today.   Await the urgent ultrasound presently, input from [facility], and follow-up in 3 to 4 weeks time, follow-up sooner as needed.    Jamelle Haring, MD 02/03/20 9:12 AM
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
## 2020-02-03 ENCOUNTER — Ambulatory Visit: Payer: Commercial Managed Care - PPO | Admitting: Internal Medicine

## 2020-02-03 ENCOUNTER — Ambulatory Visit
Admission: RE | Admit: 2020-02-03 | Discharge: 2020-02-03 | Disposition: A | Discharge status: Home / Self Care | Payer: Commercial Managed Care - PPO | Source: Ambulatory Visit | Attending: Internal Medicine | Admitting: Internal Medicine

## 2020-02-03 ENCOUNTER — Telehealth: Payer: Self-pay | Admitting: Internal Medicine

## 2020-02-03 ENCOUNTER — Other Ambulatory Visit: Payer: Self-pay

## 2020-02-03 ENCOUNTER — Encounter: Payer: Self-pay | Admitting: Internal Medicine

## 2020-02-03 VITALS — BP 170/100 | HR 108 | Temp 98.9°F | Resp 16 | Ht 73.0 in | Wt >= 6400 oz

## 2020-02-03 DIAGNOSIS — M79605 Pain in left leg: Secondary | ICD-10-CM

## 2020-02-03 DIAGNOSIS — I809 Phlebitis and thrombophlebitis of unspecified site: Secondary | ICD-10-CM

## 2020-02-03 DIAGNOSIS — R030 Elevated blood-pressure reading, without diagnosis of hypertension: Secondary | ICD-10-CM

## 2020-02-03 NOTE — Telephone Encounter (Signed)
[place] complete and negative for DVT. Patient has been discharged.

## 2020-02-03 NOTE — Patient Instructions (Addendum)
Please obtain the ultrasound ordered  A referral was placed for [facility].  Try to keep the leg elevated, can apply warm compresses, also using extra strength Tylenol products in the short-term as needed for discomfort

## 2020-02-03 NOTE — Telephone Encounter (Signed)
Patient called to ask the doctor to change his referral status to stat so that he can make an appt. As soon as possible.  Please call to discuss at [phone]

## 2020-02-03 NOTE — Telephone Encounter (Signed)
As I noted to you earlier, could you please call where they do the ultrasound/duplex studies to R/O DVT and ask to have him scheduled for today. It is not clear to me how to order these. I was not sure I ordered it correctly and if we need to change the order, can do so. Thanks

## 2020-02-03 NOTE — Telephone Encounter (Signed)
Great new's , Thank you !

## 2020-02-03 NOTE — Telephone Encounter (Signed)
Can you change it to stat. If you want it to be done today it's probably going to have to be stat.

## 2020-02-17 ENCOUNTER — Ambulatory Visit (INDEPENDENT_AMBULATORY_CARE_PROVIDER_SITE_OTHER): Payer: Commercial Managed Care - PPO | Admitting: Vascular Surgery

## 2020-02-17 ENCOUNTER — Encounter (INDEPENDENT_AMBULATORY_CARE_PROVIDER_SITE_OTHER): Payer: Self-pay | Admitting: Vascular Surgery

## 2020-02-17 ENCOUNTER — Other Ambulatory Visit: Payer: Self-pay

## 2020-02-17 VITALS — BP 149/94 | HR 96 | Resp 17 | Ht 73.0 in | Wt >= 6400 oz

## 2020-02-17 DIAGNOSIS — M79604 Pain in right leg: Secondary | ICD-10-CM

## 2020-02-17 DIAGNOSIS — I809 Phlebitis and thrombophlebitis of unspecified site: Secondary | ICD-10-CM | POA: Diagnosis not present

## 2020-02-17 DIAGNOSIS — E0822 Diabetes mellitus due to underlying condition with diabetic chronic kidney disease: Secondary | ICD-10-CM | POA: Diagnosis not present

## 2020-02-17 DIAGNOSIS — M79609 Pain in unspecified limb: Secondary | ICD-10-CM | POA: Insufficient documentation

## 2020-02-17 DIAGNOSIS — N181 Chronic kidney disease, stage 1: Secondary | ICD-10-CM

## 2020-02-17 DIAGNOSIS — I10 Essential (primary) hypertension: Secondary | ICD-10-CM

## 2020-02-17 DIAGNOSIS — M79605 Pain in left leg: Secondary | ICD-10-CM

## 2020-02-17 DIAGNOSIS — I83813 Varicose veins of bilateral lower extremities with pain: Secondary | ICD-10-CM

## 2020-02-17 NOTE — Assessment & Plan Note (Signed)
blood pressure control important in reducing the progression of atherosclerotic disease. On appropriate oral medications.  

## 2020-02-17 NOTE — Assessment & Plan Note (Signed)
The patient has had previous treatment for venous disease in the past.  He will resume wearing his 20 to 30 mmHg compression stockings daily.  He will elevate his legs more.  He will take a baby aspirin and anti-inflammatories as needed for discomfort.  We discussed getting a venous duplex to evaluate the venous system in the near future at his convenience.  We will see him back following this to discuss further treatment options.

## 2020-02-17 NOTE — Assessment & Plan Note (Signed)
The patient has a previous history of a may very well have a new episode of phlebitis on the left leg.  Duplex will be done in the near future at his convenience.

## 2020-02-17 NOTE — Progress Notes (Signed)
Patient ID: Walter Parks, male   DOB: 1986-03-18, 34 y.o.   MRN: [id number]  Chief Complaint  Patient presents with  . New Patient (Initial Visit)    ref Dorris Fetch le pain     HPI Walter Parks is a 34 y.o. male.  I am asked to see the patient by Dr.  Dorris Fetch for evaluation of leg pain and varicose veins.  The patient has apparently undergone venous treatment about 7 years ago here but has not had any evaluation or treatment since that time.  He has fairly extensive varicosities present on the lateral aspects of both legs that have been there for years.  These are gradually increased after the treatment of the other veins.  His right leg currently has some mild swelling but not really much pain.  His left leg varicosities have become very painful and he has pain rotating down the lateral aspect of his left leg.  This is associated with some swelling and discoloration.  He denies any fevers or chills.  No chest pain or shortness of breath.  He stands for long periods of time at his work and that certainly has exacerbated the situation.  No open wounds or infection   Past Medical History:  Diagnosis Date  . Depression   . Diabetes mellitus without complication (HCC)   . GERD (gastroesophageal reflux disease)   . Hx of tobacco use, presenting hazards to health 06/25/2015  . Hypertension   . Obesity   . Sleep apnea    on an APAP machine    Past Surgical History:  Procedure Laterality Date  . LAPAROSCOPIC GASTRIC RESTRICTIVE DUODENAL PROCEDURE (DUODENAL SWITCH) N/A 11/02/2015   Procedure: LAPAROSCOPIC GASTRIC RESTRICTIVE DUODENAL PROCEDURE (DUODENAL SWITCH);  Surgeon: Everette Rank, MD;  Location: [facility] ORS;  Service: General;  Laterality: N/A;  . TONSILLECTOMY    . TONSILLECTOMY       Family History  Problem Relation Age of Onset  . Hypertension Mother   . Hypertension Father   . Diabetes Maternal Aunt   . Hypertension Maternal Grandmother   . Hypertension Maternal Grandfather    . Stroke Paternal Grandmother   . Hypertension Paternal Grandmother   . Hypertension Paternal Grandfather   . Diabetes Cousin   . Heart disease Neg Hx   . COPD Neg Hx      Social History   Tobacco Use  . Smoking status: Current Some Day Smoker    Packs/day: 0.00    Years: 18.00    Pack years: 0.00    Types: Cigarettes    Last attempt to quit: 08/01/2015    Years since quitting: 4.5  . Smokeless tobacco: Never Used  Vaping Use  . Vaping Use: Never used  Substance Use Topics  . Alcohol use: Yes    Alcohol/week: 0.0 standard drinks    Comment: socially  . Drug use: No    Allergies  Allergen Reactions  . Lemon Flavor Hives  . Lemon Oil Swelling    Current Outpatient Medications  Medication Sig Dispense Refill  . calcium gluconate 500 MG tablet Take 1 tablet by mouth daily.    . diazepam (VALIUM) 10 MG tablet  (Patient not taking: Reported on 02/17/2020)    . fluticasone (FLONASE) 50 MCG/ACT nasal spray Place into both nostrils. (Patient not taking: Reported on 02/17/2020)    . Multiple Vitamins-Minerals (MULTIVITAMIN WITH MINERALS) tablet Take 1 tablet by mouth daily.    Marland Kitchen sulfamethoxazole-trimethoprim (BACTRIM DS) 800-160 MG tablet  Take 1 tablet by mouth every 12 (twelve) hours. (Patient not taking: Reported on 02/17/2020) 28 tablet 0   No current facility-administered medications for this visit.      REVIEW OF SYSTEMS (Negative unless checked)  Constitutional: [] Weight loss  [] Fever  [] Chills Cardiac: [] Chest pain   [] Chest pressure   [] Palpitations   [] Shortness of breath when laying flat   [] Shortness of breath at rest   [] Shortness of breath with exertion. Vascular:  [x] Pain in legs with walking   [x] Pain in legs at rest   [] Pain in legs when laying flat   [] Claudication   [] Pain in feet when walking  [] Pain in feet at rest  [] Pain in feet when laying flat   [] History of DVT   [] Phlebitis   [x] Swelling in legs   [x] Varicose veins   [] Non-healing  ulcers Pulmonary:   [] Uses home oxygen   [] Productive cough   [] Hemoptysis   [] Wheeze  [] COPD   [] Asthma Neurologic:  [] Dizziness  [] Blackouts   [] Seizures   [] History of stroke   [] History of TIA  [] Aphasia   [] Temporary blindness   [] Dysphagia   [] Weakness or numbness in arms   [] Weakness or numbness in legs Musculoskeletal:  [] Arthritis   [] Joint swelling   [] Joint pain   [] Low back pain Hematologic:  [] Easy bruising  [] Easy bleeding   [] Hypercoagulable state   [] Anemic  [] Hepatitis Gastrointestinal:  [] Blood in stool   [] Vomiting blood  [] Gastroesophageal reflux/heartburn   [] Abdominal pain Genitourinary:  [] Chronic kidney disease   [] Difficult urination  [] Frequent urination  [] Burning with urination   [] Hematuria Skin:  [] Rashes   [] Ulcers   [] Wounds Psychological:  [] History of anxiety   []  History of major depression.    Physical Exam BP (!) 149/94 (BP Location: Right Arm)   Pulse 96   Resp 17   Ht 6\' 1"  (1.854 m)   Wt (!) 413 lb (187.3 kg)   BMI 54.49 kg/m  Gen:  WD/WN, NAD. Morbidly obese Head: [place]/AT, No temporalis wasting.  Ear/Nose/Throat: Hearing grossly intact, nares w/o erythema or drainage, oropharynx w/o Erythema/Exudate Eyes: Conjunctiva clear, sclera non-icteric  Neck: trachea midline.  No JVD.  Pulmonary:  Good air movement, respirations not labored, no use of accessory muscles  Cardiac: RRR, no JVD Vascular: extensive varicosities on both legs bilaterally, particularly on the lateral aspect of the thighs and upper calves. Moderate stasis dermatitis changes present bilaterally. Vessel Right Left  Radial Palpable Palpable                          DP 2+ 2+  PT 1+ 1+   Gastrointestinal:. No masses, surgical incisions, or scars. Musculoskeletal: M/S 5/5 throughout.  Extremities without ischemic changes.  No deformity or atrophy. Mild BLE edema. Neurologic: Sensation grossly intact in extremities.  Symmetrical.  Speech is fluent. Motor exam as listed  above. Psychiatric: Judgment intact, Mood & affect appropriate for pt's clinical situation. Dermatologic: No rashes or ulcers noted.  No cellulitis or open wounds.    Radiology Venous Img Lower Unilateral Left (DVT)  Result Date: 02/03/2020 CLINICAL DATA:  34 year old male with left lower extremity pain for 5 days. EXAM: LEFT LOWER EXTREMITY VENOUS DOPPLER ULTRASOUND TECHNIQUE: Gray-scale sonography with graded compression, as well as color Doppler and duplex ultrasound were performed to evaluate the left lower extremity deep venous systems from the level of the common femoral vein and including the common femoral, femoral, profunda femoral, popliteal and calf  veins including the posterior tibial, peroneal and gastrocnemius veins when visible. Spectral Doppler was utilized to evaluate flow at rest and with distal augmentation maneuvers in the common femoral, femoral and popliteal veins. The contralateral common femoral vein was also evaluated for comparison. COMPARISON:  None. FINDINGS: LEFT LOWER EXTREMITY Common Femoral Vein: No evidence of thrombus. Normal compressibility, respiratory phasicity and response to augmentation. Central Greater Saphenous Vein: No evidence of thrombus. Normal compressibility and flow on color Doppler imaging. Central Profunda Femoral Vein: No evidence of thrombus. Normal compressibility and flow on color Doppler imaging. Femoral Vein: No evidence of thrombus. Normal compressibility, respiratory phasicity and response to augmentation. Popliteal Vein: No evidence of thrombus. Normal compressibility, respiratory phasicity and response to augmentation. Calf Veins: No evidence of thrombus. Normal compressibility and flow on color Doppler imaging. Other Findings:  None. RIGHT LOWER EXTREMITY Common Femoral Vein: No evidence of thrombus. Normal compressibility, respiratory phasicity and response to augmentation. IMPRESSION: No evidence of left lower extremity deep venous  thrombosis. Marliss Coots, MD [facility] Electronically Signed   By: Marliss Coots MD   On: 02/03/2020 15:03    Labs No results found for this or any previous visit (from the past 2160 hour(s)).  Assessment/Plan:  Essential hypertension, benign blood pressure control important in reducing the progression of atherosclerotic disease. On appropriate oral medications.   Diabetes mellitus due to underlying condition with stage 1 chronic kidney disease, without long-term current use of insulin (HCC) blood glucose control important in reducing the progression of atherosclerotic disease. Also, involved in wound healing. On appropriate medications.   Phlebitis and thrombophlebitis The patient has a previous history of a may very well have a new episode of phlebitis on the left leg.  Duplex will be done in the near future at his convenience.  Varicose veins of leg with pain, bilateral The patient has had previous treatment for venous disease in the past.  He will resume wearing his 20 to 30 mmHg compression stockings daily.  He will elevate his legs more.  He will take a baby aspirin and anti-inflammatories as needed for discomfort.  We discussed getting a venous duplex to evaluate the venous system in the near future at his convenience.  We will see him back following this to discuss further treatment options.      Festus Barren 02/17/2020, 5:03 PM   This note was created with Dragon medical transcription system.  Any errors from dictation are unintentional.

## 2020-02-17 NOTE — Assessment & Plan Note (Signed)
blood glucose control important in reducing the progression of atherosclerotic disease. Also, involved in wound healing. On appropriate medications.  

## 2020-02-17 NOTE — Patient Instructions (Signed)

## 2020-02-24 ENCOUNTER — Encounter (INDEPENDENT_AMBULATORY_CARE_PROVIDER_SITE_OTHER): Payer: Self-pay | Admitting: Vascular Surgery

## 2020-02-26 ENCOUNTER — Other Ambulatory Visit: Payer: Self-pay

## 2020-02-26 ENCOUNTER — Ambulatory Visit (INDEPENDENT_AMBULATORY_CARE_PROVIDER_SITE_OTHER): Payer: Commercial Managed Care - PPO

## 2020-02-26 ENCOUNTER — Ambulatory Visit (INDEPENDENT_AMBULATORY_CARE_PROVIDER_SITE_OTHER): Payer: Commercial Managed Care - PPO | Admitting: Nurse Practitioner

## 2020-02-26 ENCOUNTER — Encounter (INDEPENDENT_AMBULATORY_CARE_PROVIDER_SITE_OTHER): Payer: Self-pay | Admitting: Nurse Practitioner

## 2020-02-26 VITALS — BP 149/102 | HR 83 | Resp 16 | Wt >= 6400 oz

## 2020-02-26 DIAGNOSIS — E119 Type 2 diabetes mellitus without complications: Secondary | ICD-10-CM

## 2020-02-26 DIAGNOSIS — I83813 Varicose veins of bilateral lower extremities with pain: Secondary | ICD-10-CM

## 2020-02-26 DIAGNOSIS — I10 Essential (primary) hypertension: Secondary | ICD-10-CM | POA: Diagnosis not present

## 2020-03-01 ENCOUNTER — Encounter (INDEPENDENT_AMBULATORY_CARE_PROVIDER_SITE_OTHER): Payer: Self-pay | Admitting: Nurse Practitioner

## 2020-03-01 NOTE — Progress Notes (Signed)
Subjective:    Patient ID: Walter Parks, male    DOB: 05-Sep-1985, 34 y.o.   MRN: [id number] Chief Complaint  Patient presents with  . Follow-up    Ultrasound follow up    Walter Parks is a 34 year old male who presents today for evaluation of his bilateral varicosities.  The patient notes that he previously had treatment to his varicose veins in 2014.  Based on his description it sounds as if foam sclerotherapy were done.  The patient notes that several years ago in 2017 he was at his largest weight of 600 pounds and underwent weight loss surgery and his lowest weight was 300 pounds.  As of many others, during the pandemic the patient has regained some of his weight.  The patient notes that with the weight gain his varicosities have returned.  He has multiple residual varicosities bilaterally which continued to hurt with dependent positions and are tender to palpation.  He does have swelling of the lower extremities as well.  He continues to wear graduated compression socks on a daily basis however they are not elevating the pain and discomfort.  He also utilizes over-the-counter anti-inflammatory medications for pain.  The patient also has a previous history of hemorrhage from these varicosities.  The patient notes that he is beginning to have the same pain as he had previously with his varicosities prior to hemorrhage.  The patient lost a significant amount of blood following his most recent hemorrhage several years ago.  The patient also is very active daily with his occupation.  However these varicosities are causing pain and problems with working with household activities such as pregnancy doing dishes.  Today noninvasive studies show no evidence of DVT or superficial venous thrombosis bilaterally.  The right lower extremity has evidence of deep venous insufficiency in the common femoral vein as well as reflux in the mid great saphenous vein.  The patient has large tortuous varicosities in the left  medial thigh and it shows some evidence of foam treatment.     Review of Systems  Cardiovascular: Positive for leg swelling.  Hematological: Bruises/bleeds easily.  All other systems reviewed and are negative.      Objective:   Physical Exam Vitals reviewed.  Constitutional:      Appearance: He is obese.  HENT:     Head: Normocephalic.  Musculoskeletal:     Right lower leg: Edema present.     Left lower leg: Edema present.  Skin:    Comments: Large varicosities bilaterally   Neurological:     Mental Status: He is alert and oriented to person, place, and time.  Psychiatric:        Mood and Affect: Mood normal.        Behavior: Behavior normal.        Thought Content: Thought content normal.        Judgment: Judgment normal.     BP (!) 149/102 (BP Location: Right Arm)   Pulse 83   Resp 16   Wt (!) 415 lb (188.2 kg)   BMI 54.75 kg/m   Past Medical History:  Diagnosis Date  . Depression   . Diabetes mellitus without complication (HCC)   . GERD (gastroesophageal reflux disease)   . Hx of tobacco use, presenting hazards to health 06/25/2015  . Hypertension   . Obesity   . Sleep apnea    on an APAP machine    Social History   Socioeconomic History  . Marital status: Single  Spouse name: Not on file  . Number of children: 2  . Years of education: Not on file  . Highest education level: Some college, no degree  Occupational History  . Not on file  Tobacco Use  . Smoking status: Current Some Day Smoker    Packs/day: 0.00    Years: 18.00    Pack years: 0.00    Types: Cigarettes    Last attempt to quit: 08/01/2015    Years since quitting: 4.5  . Smokeless tobacco: Never Used  Vaping Use  . Vaping Use: Never used  Substance and Sexual Activity  . Alcohol use: Yes    Alcohol/week: 0.0 standard drinks    Comment: socially  . Drug use: No  . Sexual activity: Yes    Partners: Female    Birth control/protection: None  Other Topics Concern  . Not on file   Social History Narrative  . Not on file   Social Determinants of Health   Financial Resource Strain: Not on file  Food Insecurity: Not on file  Transportation Needs: Not on file  Physical Activity: Not on file  Stress: Not on file  Social Connections: Not on file  Intimate Partner Violence: Not on file    Past Surgical History:  Procedure Laterality Date  . LAPAROSCOPIC GASTRIC RESTRICTIVE DUODENAL PROCEDURE (DUODENAL SWITCH) N/A 11/02/2015   Procedure: LAPAROSCOPIC GASTRIC RESTRICTIVE DUODENAL PROCEDURE (DUODENAL SWITCH);  Surgeon: Everette Rank, MD;  Location: [facility] ORS;  Service: General;  Laterality: N/A;  . TONSILLECTOMY    . TONSILLECTOMY      Family History  Problem Relation Age of Onset  . Hypertension Mother   . Hypertension Father   . Diabetes Maternal Aunt   . Hypertension Maternal Grandmother   . Hypertension Maternal Grandfather   . Stroke Paternal Grandmother   . Hypertension Paternal Grandmother   . Hypertension Paternal Grandfather   . Diabetes Cousin   . Heart disease Neg Hx   . COPD Neg Hx     Allergies  Allergen Reactions  . Lemon Flavor Hives  . Lemon Oil Swelling    CBC Latest Ref Rng & Units 04/11/2019 11/03/2015  WBC 4.0 - 10.5 K/uL 12.0(H) 14.8(H)  Hemoglobin 13.0 - 17.0 g/dL 62.2 63.3  Hematocrit 35.4 - 52.0 % 48.5 47.1  Platelets 150 - 400 K/uL 266 303      CMP     Component Value Date/Time   NA 138 04/11/2019 2123   NA 140 04/19/2015 0938   K 4.0 04/11/2019 2123   CL 103 04/11/2019 2123   CO2 26 04/11/2019 2123   GLUCOSE 104 (H) 04/11/2019 2123   BUN 12 04/11/2019 2123   BUN 8 04/19/2015 0938   CREATININE 0.87 04/11/2019 2123   CREATININE 0.92 11/29/2015 1107   CALCIUM 9.0 04/11/2019 2123   PROT 7.1 04/11/2019 2123   PROT 7.8 04/19/2015 0938   ALBUMIN 3.9 04/11/2019 2123   ALBUMIN 4.2 04/19/2015 0938   AST 28 04/11/2019 2123   ALT 21 04/11/2019 2123   ALKPHOS 109 04/11/2019 2123   BILITOT 0.6 04/11/2019 2123    BILITOT 0.4 04/19/2015 0938   GFRNONAA >60 04/11/2019 2123   GFRNONAA >89 11/29/2015 1107   GFRAA >60 04/11/2019 2123   GFRAA >89 11/29/2015 1107     No results found.     Assessment & Plan:   1. Varicose veins of leg with pain, bilateral Recommend:  The patient has had successful ablation of the previously incompetent saphenous venous system  but still has persistent symptoms of pain and swelling that are having a negative impact on daily life and daily activities.  There is also the concern that the patient could have another hemorrhagic episode.  Patient should undergo injection foam sclerotherapy to treat the residual varicosities.  The risks, benefits and alternative therapies were reviewed in detail with the patient.  All questions were answered.  The patient agrees to proceed with sclerotherapy at their convenience.  The patient will continue wearing the graduated compression stockings and using the over-the-counter pain medications to treat her symptoms.       2. Essential hypertension, benign Continue antihypertensive medications as already ordered, these medications have been reviewed and there are no changes at this time.   3. Type 2 diabetes mellitus without complication, without long-term current use of insulin (HCC) Continue hypoglycemic medications as already ordered, these medications have been reviewed and there are no changes at this time.  Hgb A1C to be monitored as already arranged by primary service    Current Outpatient Medications on File Prior to Visit  Medication Sig Dispense Refill  . calcium gluconate 500 MG tablet Take 1 tablet by mouth daily.    . Multiple Vitamins-Minerals (MULTIVITAMIN WITH MINERALS) tablet Take 1 tablet by mouth daily.    . diazepam (VALIUM) 10 MG tablet  (Patient not taking: No sig reported)    . fluticasone (FLONASE) 50 MCG/ACT nasal spray Place into both nostrils. (Patient not taking: No sig reported)    .  sulfamethoxazole-trimethoprim (BACTRIM DS) 800-160 MG tablet Take 1 tablet by mouth every 12 (twelve) hours. (Patient not taking: No sig reported) 28 tablet 0   No current facility-administered medications on file prior to visit.    There are no Patient Instructions on file for this visit. No follow-ups on file.   Georgiana Spinner, NP

## 2020-03-04 DIAGNOSIS — E786 Lipoprotein deficiency: Secondary | ICD-10-CM | POA: Insufficient documentation

## 2020-03-04 DIAGNOSIS — R7309 Other abnormal glucose: Secondary | ICD-10-CM | POA: Insufficient documentation

## 2020-03-04 NOTE — Progress Notes (Deleted)
Walter Parks is a 34 y.o. male Last visit with me was 02/03/2020 Follows up today to recheck blood pressure and f/u  An ultrasound was obtained immediately after that visit and help to rule out a DVT concern  He saw vascular surgery on 02/17/2020 with the following notes from the assessment/plan:  Assessment/Plan: Phlebitis and thrombophlebitis The Walter Parks has a previous history of a may very well have a new episode of phlebitis on the left leg.  Duplex will be done in the near future at his convenience.  Varicose veins of leg with pain, bilateral The Walter Parks has had previous treatment for venous disease in the past.  He will resume wearing his 20 to 30 mmHg compression stockings daily.  He will elevate his legs more.  He will take a baby aspirin and anti-inflammatories as needed for discomfort.  We discussed getting a venous duplex to evaluate the venous system in the near future at his convenience.  We will see him back following this to discuss further treatment options.  On follow-up with vascular surgery 02/26/2020 the following was noted:  1. Varicose veins of leg with pain, bilateral Recommend:  The Walter Parks has had successful ablation of the previously incompetent saphenous venous system but still has persistent symptoms of pain and swelling that are having a negative impact on daily life and daily activities.  There is also the concern that the Walter Parks could have another hemorrhagic episode.  Walter Parks should undergo injection foam sclerotherapy to treat the residual varicosities.  The risks, benefits and alternative therapies were reviewed in detail with the Walter Parks.  All questions were answered.  The Walter Parks agrees to proceed with sclerotherapy at their convenience.  The Walter Parks will continue wearing the graduated compression stockings and using the over-the-counter pain medications to treat her symptoms.     He notes the symptoms have improved,  HTN Medication regimen-none He  has been on medicines in the past for hypertension, although not in the more recent past. His blood pressure was slightly elevated last visit, and felt best not to add medicines at that visit with his pain possibly contributing. Noted as his pain improves, to check some blood pressures at home, write them down, and bring them on this follow-up visit.   A blood pressure medicine may need to be added if it is remaining elevated.. Blood pressure checks at home have been BP Readings from Last 3 Encounters:  02/26/20 (!) 149/102  02/17/20 (!) 149/94  02/03/20 (!) 170/100   Denies recent chest pains, palpitations, shortness of breath, headaches or vision changes  Morbid obesity Wt Readings from Last 3 Encounters:  02/26/20 (!) 415 lb (188.2 kg)  02/17/20 (!) 413 lb (187.3 kg)  02/03/20 (!) 410 lb 3.2 oz (186.1 kg)   H/o of elevated a1c A1c lveel improved significantly on recheck  Lab Results  Component Value Date   HGBA1C 5.7 (H) 11/29/2015   HGBA1C 7.1 (H) 04/19/2015   HGBA1C 7.4 (H) 12/16/2014   Lab Results  Component Value Date   LDLCALC 82 11/29/2015   CREATININE 0.87 04/11/2019    Has a history of tobacco use     Recheck of the blood pressure by myself after resting in the office was 140/94 on the left. Physical Exam   NAD, masked, pleasant HEENT - [place]/AT, sclera anicteric,  Car - RRR, not tachycardic on my exam with his heart rate approximately 96 and regular. Pulm- RR and effort normal at rest,  Abd -obese Ext -pronounced varicosities noted in  the left thigh region anteriorly and laterally, with marked tenderness noted palpating these regions.  The lateral varicosities are more distal towards the knee joint, and tenderness noted palpating more proximal as well to about mid thigh level.  Mild discomfort palpating the posterior thigh although not marked.  No increased warmth of these areas noted, no marked erythema.  No calf swelling or tenderness, no marked erythema or  increased warmth of the calf. Sensation intact to light touch in the distal extremity, and he denies any numbness or tingling distally. Distal pulse in the left lower extremity adequate. No loss of strength of the foot, with adequate dorsi and plantar flexion. Neuro/psychiatric - affect was not flat, appropriate with conversation             Alert with normal speech   1. Pain of left lower extremity 2. Phlebitis and thrombophlebitis Walter Parks has seen vascular surgery with sclerotherapy recommended as noted above Continue follow-up with vascular surgery  3. Morbid obesity (HCC) A likely contributor to the above,  4. Elevated BP without diagnosis of hypertension Blood pressures have   5. Elevated A1c history Recommend checking an A1c again  6. Low HDL Recommend repeating a lipid panel    Discussed the importance of getting repeat labs done to help ensure his A1c is still controlled and not consistent with diabetes. His last lipid panel was also a long time ago, 2017 His comp panel from January 2021 was reviewed and was all normal except for a slightly elevated glucose at 104 Given the upcoming Christmas holiday, agreed to have him return fasting before his next planned follow-up in approximately 3-4 weeks time to then review the lab results as well as follow-up with his blood pressure. Can follow-up sooner as needed.

## 2020-03-09 ENCOUNTER — Ambulatory Visit: Payer: Commercial Managed Care - PPO | Admitting: Internal Medicine

## 2020-06-30 ENCOUNTER — Other Ambulatory Visit: Payer: Self-pay

## 2020-06-30 ENCOUNTER — Ambulatory Visit
Admission: EM | Admit: 2020-06-30 | Discharge: 2020-06-30 | Disposition: A | Discharge status: Home / Self Care | Payer: 59 | Attending: Emergency Medicine | Admitting: Emergency Medicine

## 2020-06-30 DIAGNOSIS — N3000 Acute cystitis without hematuria: Secondary | ICD-10-CM

## 2020-06-30 LAB — URINALYSIS, COMPLETE (UACMP) WITH MICROSCOPIC
Bilirubin Urine: NEGATIVE
Glucose, UA: NEGATIVE mg/dL
Ketones, ur: NEGATIVE mg/dL
Nitrite: NEGATIVE
Specific Gravity, Urine: 1.025 (ref 1.005–1.030)
WBC, UA: >50 WBC/hpf (ref 0–5)
pH: 5.5 (ref 5.0–8.0)

## 2020-06-30 MED ORDER — CIPROFLOXACIN HCL 500 MG PO TABS: 500.0000 mg | ORAL_TABLET | Freq: Two times a day (BID) | ORAL | 0 refills | Status: AC

## 2020-06-30 NOTE — ED Provider Notes (Signed)
[facility]    CSN: [id number] Arrival date & time: 06/30/20  1557      History   Chief Complaint Chief Complaint  Patient presents with  . Dysuria    HPI Walter Parks is a 35 y.o. male.   HPI   35 year old male here for evaluation of painful urination.  Patient reports that he has been experiencing painful urination for the last 6 days.  The pain is especially strong at the end of urination.  Patient reports that his urine has become darker in color and also cloudy.  Patient is also had increased urinary urgency, frequency, and nocturia.  Additionally, patient is now reporting some low back pain and nausea.  Patient denies blood in his urine, fever, vomiting, trouble starting his stream, trouble starting urination, or penile discharge.  Patient reports that he has had UTI symptoms in the past but they did not require him to seek treatment.  Past Medical History:  Diagnosis Date  . Depression   . Diabetes mellitus without complication (HCC)   . GERD (gastroesophageal reflux disease)   . Hx of tobacco use, presenting hazards to health 06/25/2015  . Hypertension   . Obesity   . Sleep apnea    on an APAP machine    Patient Active Problem List   Diagnosis Date Noted  . Low HDL (under 40) 03/04/2020  . Elevated hemoglobin A1c 03/04/2020  . Pain in limb 02/17/2020  . Varicose veins of leg with pain, bilateral 02/17/2020  . Phlebitis and thrombophlebitis 02/03/2020  . Diabetes mellitus due to underlying condition with stage 1 chronic kidney disease, without long-term current use of insulin (HCC) 06/25/2015  . Hx of tobacco use, presenting hazards to health 06/25/2015  . OSA (obstructive sleep apnea) 06/25/2015  . Moderate smoker (20 or less per day) 05/26/2015  . Medication monitoring encounter 04/19/2015  . Type 2 diabetes mellitus (HCC) 12/16/2014  . Essential hypertension, benign 12/16/2014  . Morbid obesity (HCC) 12/16/2014  . Sleep apnea 12/16/2014  . Acid  reflux 12/16/2014    Past Surgical History:  Procedure Laterality Date  . LAPAROSCOPIC GASTRIC RESTRICTIVE DUODENAL PROCEDURE (DUODENAL SWITCH) N/A 11/02/2015   Procedure: LAPAROSCOPIC GASTRIC RESTRICTIVE DUODENAL PROCEDURE (DUODENAL SWITCH);  Surgeon: Everette Rank, MD;  Location: [facility] ORS;  Service: General;  Laterality: N/A;  . TONSILLECTOMY    . TONSILLECTOMY         Home Medications    Prior to Admission medications   Medication Sig Start Date End Date Taking? Authorizing Provider  calcium gluconate 500 MG tablet Take 1 tablet by mouth daily.   Yes [provider]  ciprofloxacin (CIPRO) 500 MG tablet Take 1 tablet (500 mg total) by mouth 2 (two) times daily for 7 days. 06/30/20 07/07/20 Yes Becky Augusta, NP  Multiple Vitamins-Minerals (MULTIVITAMIN WITH MINERALS) tablet Take 1 tablet by mouth daily.   Yes [provider]  fluticasone (FLONASE) 50 MCG/ACT nasal spray Place into both nostrils. Patient not taking: No sig reported 01/16/20 06/30/20  [provider]    Family History Family History  Problem Relation Age of Onset  . Hypertension Mother   . Hypertension Father   . Diabetes Maternal Aunt   . Hypertension Maternal Grandmother   . Hypertension Maternal Grandfather   . Stroke Paternal Grandmother   . Hypertension Paternal Grandmother   . Hypertension Paternal Grandfather   . Diabetes Cousin   . Heart disease Neg Hx   . COPD Neg Hx  Social History Social History   Tobacco Use  . Smoking status: Current Some Day Smoker    Packs/day: 0.00    Years: 18.00    Pack years: 0.00    Types: Cigarettes    Last attempt to quit: 08/01/2015    Years since quitting: 4.9  . Smokeless tobacco: Never Used  Vaping Use  . Vaping Use: Never used  Substance Use Topics  . Alcohol use: Yes    Alcohol/week: 0.0 standard drinks    Comment: socially  . Drug use: No     Allergies   Lemon flavor and Lemon oil   Review of Systems Review of  Systems  Constitutional: Negative for activity change, appetite change and fever.  Gastrointestinal: Positive for nausea. Negative for abdominal pain, diarrhea and vomiting.  Genitourinary: Positive for dysuria, frequency and urgency. Negative for hematuria, penile discharge, penile pain and testicular pain.  Musculoskeletal: Positive for back pain.  Skin: Negative for rash.  Hematological: Negative.   Psychiatric/Behavioral: Negative.      Physical Exam Triage Vital Signs ED Triage Vitals  Enc Vitals Group     BP 06/30/20 1641 (!) 148/89     Pulse Rate 06/30/20 1641 94     Resp 06/30/20 1641 18     Temp 06/30/20 1641 98.4 F (36.9 C)     Temp Source 06/30/20 1641 Oral     SpO2 06/30/20 1641 100 %     Weight 06/30/20 1639 (!) 382 lb (173.3 kg)     Height 06/30/20 1639 6\' 2"  (1.88 m)     Head Circumference --      Peak Flow --      Pain Score 06/30/20 1639 6     Pain Loc --      Pain Edu? --      Excl. in GC? --    No data found.  Updated Vital Signs BP (!) 148/89 (BP Location: Left Arm)   Pulse 94   Temp 98.4 F (36.9 C) (Oral)   Resp 18   Ht 6\' 2"  (1.88 m)   Wt (!) 382 lb (173.3 kg)   SpO2 100%   BMI 49.05 kg/m   Visual Acuity Right Eye Distance:   Left Eye Distance:   Bilateral Distance:    Right Eye Near:   Left Eye Near:    Bilateral Near:     Physical Exam Vitals and nursing note reviewed.  Constitutional:      General: He is not in acute distress.    Appearance: Normal appearance. He is obese. He is not ill-appearing.  HENT:     Head: Normocephalic and atraumatic.  Cardiovascular:     Rate and Rhythm: Normal rate and regular rhythm.     Pulses: Normal pulses.     Heart sounds: Normal heart sounds. No murmur heard. No gallop.   Pulmonary:     Effort: Pulmonary effort is normal.     Breath sounds: Normal breath sounds. No wheezing, rhonchi or rales.  Abdominal:     Tenderness: There is no right CVA tenderness or left CVA tenderness.  Skin:     General: Skin is warm and dry.     Capillary Refill: Capillary refill takes less than 2 seconds.     Findings: No erythema or rash.  Neurological:     General: No focal deficit present.     Mental Status: He is alert and oriented to person, place, and time.  Psychiatric:  Mood and Affect: Mood normal.        Behavior: Behavior normal.        Thought Content: Thought content normal.        Judgment: Judgment normal.      UC Treatments / Results  Labs (all labs ordered are listed, but only abnormal results are displayed) Labs Reviewed  URINALYSIS, COMPLETE (UACMP) WITH MICROSCOPIC - Abnormal; Notable for the following components:      Result Value   APPearance HAZY (*)    Hgb urine dipstick TRACE (*)    Protein, ur TRACE (*)    Leukocytes,Ua SMALL (*)    Bacteria, UA MANY (*)    All other components within normal limits  URINE CULTURE    EKG   Radiology No results found.  Procedures Procedures (including critical care time)  Medications Ordered in UC Medications - No data to display  Initial Impression / Assessment and Plan / UC Course  I have reviewed the triage vital signs and the nursing notes.  Pertinent labs & imaging results that were available during my care of the patient were reviewed by me and considered in my medical decision making (see chart for details).   Very pleasant 35 year old male here for evaluation of urinary symptoms that started 6 days ago and include painful urination, especially at the end, urinary urgency and frequency, nausea, nocturia, and low back pain.  Patient denies any fever.  Patient does endorse having similar symptoms, though not as severe, last year that he did not seek treatment for.  Patient has been using over-the-counter Azo which is help with the symptoms but when he stops taking it his symptoms return.  Patient denies any penile discharge or issues with stream quality or initiation.  Patient's physical exam reveals a  benign cardiopulmonary exam.  No CVA tenderness to percussion.  Prostate exam deferred at this point.  Will check urinalysis.  UA shows trace hemoglobin, trace protein, small leukocytes, greater than 50 WBCs, many bacteria, and WBCs in clumps.  Urine is nitrite negative.  Will send urine for culture.  We will treat patient for UTI with Cipro 500 mg twice daily x7 days.   Final Clinical Impressions(s) / UC Diagnoses   Final diagnoses:  Acute cystitis without hematuria     Discharge Instructions     Take the Cipro twice daily with food for 7 days for treatment of urinary tract infection.  You may continue to use over-the-counter AZO as needed for urinary discomfort.  Increase your oral fluid intake so that you increase your urine production and help to flush your urinary tract.  Return for reevaluation, or go to the ER, if you develop any fever, worsening pain with urination, nausea and vomiting, or chills.    ED Prescriptions    Medication Sig Dispense Auth. Provider   ciprofloxacin (CIPRO) 500 MG tablet Take 1 tablet (500 mg total) by mouth 2 (two) times daily for 7 days. 14 tablet Becky Augusta, NP     PDMP not reviewed this encounter.   Becky Augusta, NP 06/30/20 1724

## 2020-06-30 NOTE — Discharge Instructions (Addendum)
Take the Cipro twice daily with food for 7 days for treatment of urinary tract infection.  You may continue to use over-the-counter AZO as needed for urinary discomfort.  Increase your oral fluid intake so that you increase your urine production and help to flush your urinary tract.  Return for reevaluation, or go to the ER, if you develop any fever, worsening pain with urination, nausea and vomiting, or chills.

## 2020-06-30 NOTE — ED Triage Notes (Signed)
Pt c/o burning with urination, color change, lower back pain. Increasing symptoms since last week. Pt did take Azo for several days with some relief, but the symptoms returned when he stopped it. Pt reports possible nausea, denies emesis, fevers, abd pain or other symptoms.

## 2020-07-03 LAB — URINE CULTURE
Culture: >=100000 — AB
Special Requests: NORMAL

## 2021-04-28 DIAGNOSIS — H5213 Myopia, bilateral: Secondary | ICD-10-CM | POA: Diagnosis not present

## 2021-05-20 DIAGNOSIS — H5213 Myopia, bilateral: Secondary | ICD-10-CM | POA: Diagnosis not present

## 2021-08-23 ENCOUNTER — Encounter: Payer: Self-pay | Admitting: Emergency Medicine

## 2021-08-23 ENCOUNTER — Other Ambulatory Visit: Payer: Self-pay

## 2021-08-23 ENCOUNTER — Emergency Department: Payer: Medicaid Other

## 2021-08-23 ENCOUNTER — Emergency Department
Admission: EM | Admit: 2021-08-23 | Discharge: 2021-08-23 | Disposition: A | Discharge status: Home / Self Care | Payer: Medicaid Other | Attending: Emergency Medicine | Admitting: Emergency Medicine

## 2021-08-23 DIAGNOSIS — S86912A Strain of unspecified muscle(s) and tendon(s) at lower leg level, left leg, initial encounter: Secondary | ICD-10-CM | POA: Insufficient documentation

## 2021-08-23 DIAGNOSIS — X58XXXA Exposure to other specified factors, initial encounter: Secondary | ICD-10-CM | POA: Insufficient documentation

## 2021-08-23 DIAGNOSIS — M79605 Pain in left leg: Secondary | ICD-10-CM

## 2021-08-23 DIAGNOSIS — T148XXA Other injury of unspecified body region, initial encounter: Secondary | ICD-10-CM

## 2021-08-23 MED ORDER — LIDOCAINE 5 % EX PTCH: 1.0000 | MEDICATED_PATCH | Freq: Two times a day (BID) | CUTANEOUS | 0 refills | Status: AC

## 2021-08-23 MED ORDER — IBUPROFEN 600 MG PO TABS: 600.0000 mg | ORAL_TABLET | Freq: Four times a day (QID) | ORAL | 0 refills | Status: AC | PRN

## 2021-08-23 MED ORDER — ACETAMINOPHEN 500 MG PO TABS
1000.0000 mg | ORAL_TABLET | Freq: Once | ORAL | Status: AC
  Administered 2021-08-23: 1000 mg via ORAL
  Filled 2021-08-23: qty 2

## 2021-08-23 MED ORDER — LIDOCAINE 5 % EX PTCH
1.0000 | MEDICATED_PATCH | CUTANEOUS | Status: DC
  Administered 2021-08-23: 1 via TRANSDERMAL
  Filled 2021-08-23: qty 1

## 2021-08-23 MED ORDER — KETOROLAC TROMETHAMINE 15 MG/ML IJ SOLN
30.0000 mg | Freq: Once | INTRAMUSCULAR | Status: AC
  Administered 2021-08-23: 30 mg via INTRAMUSCULAR
  Filled 2021-08-23: qty 2

## 2021-08-23 MED ORDER — OXYCODONE HCL 5 MG PO TABS: 5.0000 mg | ORAL_TABLET | Freq: Three times a day (TID) | ORAL | 0 refills | Status: AC | PRN

## 2021-08-23 NOTE — ED Notes (Signed)
Pt's skin is warm and dry to touch in the left foot and leg, no obvious swelling or deformity noted, pedal pulse palpated.

## 2021-08-23 NOTE — ED Triage Notes (Signed)
Arrives from [facility] for ED evaluation of left upper leg pain.  Patient states pain started this morning.  Has history of varicose veins, KC sent for evaluation to r/o DVT

## 2021-08-23 NOTE — ED Provider Notes (Signed)
[facility] Provider Note    Event Date/Time   First MD Initiated Contact with Patient 08/23/21 1258     (approximate)   History   Leg Pain   HPI  Walter Parks is a 36 y.o. male who comes in with a evaluation for left upper leg pain.  Patient does have history of varicose veins but reports that he had sudden onset of severe pain in his left upper thigh this morning.  He reports the pain is more in the lateral side of his left leg.  He is able to ambulate but reports increasing pain after taking a few steps.  I reviewed his prior note from 01/2020 where patient was diagnosed with possible thrombophlebitis when he had similar pain previously.  Patient sent over for DVT ultrasound today.  Patient denies any falls or hitting his leg.     Physical Exam   Triage Vital Signs: ED Triage Vitals  Enc Vitals Group     BP 08/23/21 1229 (!) 158/97     Pulse Rate 08/23/21 1229 72     Resp 08/23/21 1229 19     Temp 08/23/21 1229 97.8 F (36.6 C)     Temp Source 08/23/21 1229 Oral     SpO2 08/23/21 1229 99 %     Weight 08/23/21 1117 (!) 382 lb 0.9 oz (173.3 kg)     Height 08/23/21 1117 6\' 2"  (1.88 m)     Head Circumference --      Peak Flow --      Pain Score 08/23/21 1117 10     Pain Loc --      Pain Edu? --      Excl. in GC? --     Most recent vital signs: Vitals:   08/23/21 1229  BP: (!) 158/97  Pulse: 72  Resp: 19  Temp: 97.8 F (36.6 C)  SpO2: 99%     General: Awake, no distress.  CV:  Good peripheral perfusion.  Resp:  Normal effort.  Abd:  No distention.  Other:  Patient is able to lift the leg off the bed.  He is tender with palpation over the lateral aspect of the left leg.  Is got good distal pulse.  His calf is nontender.  He is got no skin changes noted to the area and no obvious varicose veins superficially.  The leg is normal temperature.  He is able to flex at the knee and extend.  He does report report pain with trying to ambulate but  is able to bear weight.   ED Results / Procedures / Treatments   Labs (all labs ordered are listed, but only abnormal results are displayed) Labs Reviewed - No data to display    RADIOLOGY I have reviewed the ultrasound personally interpreted I do not see any evidence of DVT  PROCEDURES:  Critical Care performed: No  Procedures   MEDICATIONS ORDERED IN ED: Medications  acetaminophen (TYLENOL) tablet 1,000 mg (has no administration in time range)  ketorolac (TORADOL) 15 MG/ML injection 30 mg (has no administration in time range)  lidocaine (LIDODERM) 5 % 1 patch (has no administration in time range)     IMPRESSION / MDM / ASSESSMENT AND PLAN / ED COURSE  I reviewed the triage vital signs and the nursing notes.   Patient's presentation is most consistent with acute presentation with potential threat to life or bodily function.   Differential includes DVT.  Patient has good arterial pulses unlikely  arterial issue.  No evidence of cellulitis.  Suspect more likely musculoskeletal skeletal strain versus IT band dysfunction.  Given this was sudden in severity and onset I do not feel that this represents a cancer or fracture do not feel that x-ray is indicated given he is able to bear weight on it.  He has no evidence of cord compression denies any tingling sensation or difficulties urinating or defecating on himself.  Patient reports no relief with pain medication.  Patient is however able to stand up and bear weight on his legs and he is able to take a step.  He is able to extend and flex his knee so I do not feel he has a full hamstring tear or quadricep tear.  We discussed symptomatic treatment and we discussed using a few oxycodone for breakthrough pain mostly at nighttime but that this will need time to heal I did try to stay off of it and follow-up with orthopedics.  Expressed understanding felt comfortable with discharge home   FINAL CLINICAL IMPRESSION(S) / ED DIAGNOSES    Final diagnoses:  Left leg pain  Muscle strain     Rx / DC Orders   ED Discharge Orders          Ordered    ibuprofen (ADVIL) 600 MG tablet  Every 6 hours PRN        08/23/21 1422    lidocaine (LIDODERM) 5 %  Every 12 hours        08/23/21 1422    oxyCODONE (ROXICODONE) 5 MG immediate release tablet  Every 8 hours PRN        08/23/21 1422             Note:  This document was prepared using Dragon voice recognition software and may include unintentional dictation errors.   Concha Se, MD 08/23/21 [phone]

## 2021-08-23 NOTE — Discharge Instructions (Addendum)
Your work-up was negative for DVT ultrasound and your exam was overall reassuring with good pulses you are still able to ambulate so we would recommend symptomatic treatment for now and calling either the Ortho clinics above to make a follow-up appointment if not getting better.  He can take Tylenol 1 g every 8 hours with the ibuprofen, lidocaine patches and use the oxycodone for breakthrough pain.  Do not drive or work on the oxycodone  Return to the ER for fevers, worsening pain or swelling or any other concerns  Take oxycodone as prescribed. Do not drink alcohol, drive or participate in any other potentially dangerous activities while taking this medication as it may make you sleepy. Do not take this medication with any other sedating medications, either prescription or over-the-counter. If you were prescribed Percocet or Vicodin, do not take these with acetaminophen (Tylenol) as it is already contained within these medications.  This medication is an opiate (or narcotic) pain medication and can be habit forming. Use it as little as possible to achieve adequate pain control. Do not use or use it with extreme caution if you have a history of opiate abuse or dependence. If you are on a pain contract with your primary care doctor or a pain specialist, be sure to let them know you were prescribed this medication today from the Emergency Department. This medication is intended for your use only - do not give any to anyone else and keep it in a secure place where nobody else, especially children, have access to it.

## 2021-08-24 ENCOUNTER — Telehealth: Payer: Self-pay

## 2021-08-24 NOTE — Telephone Encounter (Signed)
Transition Care Management Unsuccessful Follow-up Telephone Call  Date of discharge and from where:  08/23/2021-[facility]  Attempts:  1st Attempt  Reason for unsuccessful TCM follow-up call:  Left voice message

## 2021-08-25 NOTE — Telephone Encounter (Signed)
Transition Care Management Unsuccessful Follow-up Telephone Call  Date of discharge and from where:  08/23/2021-[facility]  Attempts:  2nd Attempt  Reason for unsuccessful TCM follow-up call:  Left voice message

## 2021-08-26 DIAGNOSIS — I8391 Asymptomatic varicose veins of right lower extremity: Secondary | ICD-10-CM | POA: Diagnosis not present

## 2021-08-26 DIAGNOSIS — I8392 Asymptomatic varicose veins of left lower extremity: Secondary | ICD-10-CM | POA: Diagnosis not present

## 2021-08-26 DIAGNOSIS — M5416 Radiculopathy, lumbar region: Secondary | ICD-10-CM | POA: Diagnosis not present

## 2021-08-26 NOTE — Telephone Encounter (Signed)
Transition Care Management Unsuccessful Follow-up Telephone Call  Date of discharge and from where:  08/23/2021-[facility]  Attempts:  3rd Attempt  Reason for unsuccessful TCM follow-up call:  Left voice message

## 2021-09-16 ENCOUNTER — Ambulatory Visit (INDEPENDENT_AMBULATORY_CARE_PROVIDER_SITE_OTHER): Payer: Medicaid Other | Admitting: Vascular Surgery

## 2021-09-19 IMAGING — US US SCROTUM W/ DOPPLER COMPLETE
1 series · 13 of 25 positions shown · non-contrast
Comparison: None.

CLINICAL DATA: Left testicular mass and pain.

EXAM:
SCROTAL ULTRASOUND
DOPPLER ULTRASOUND OF THE TESTICLES
TECHNIQUE: Complete ultrasound examination of the testicles, epididymis, and
other scrotal structures was performed. Color and spectral Doppler
ultrasound were also utilized to evaluate blood flow to the
testicles.

[Series 1: us scrotum w/ doppler complete · 13 of 33 slices shown]
[im 1/33]
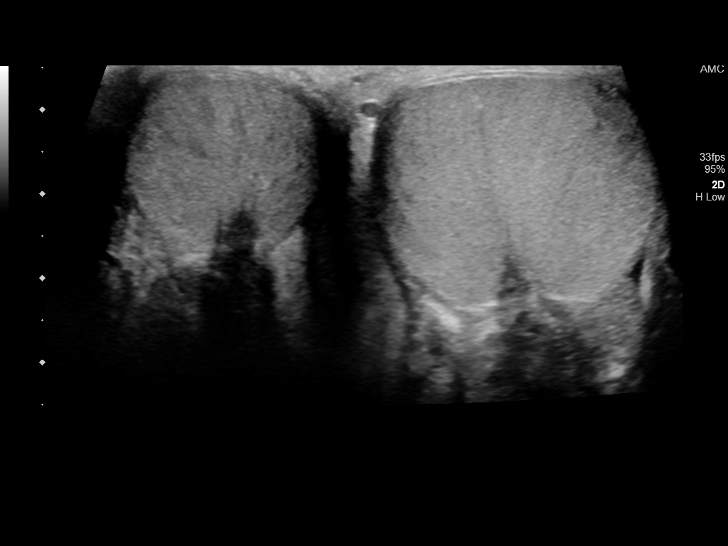
[im 3/33]
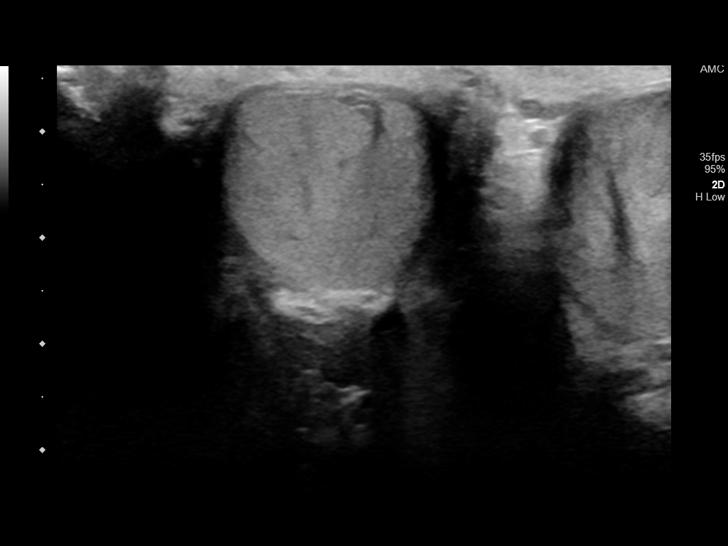
[im 6/33]
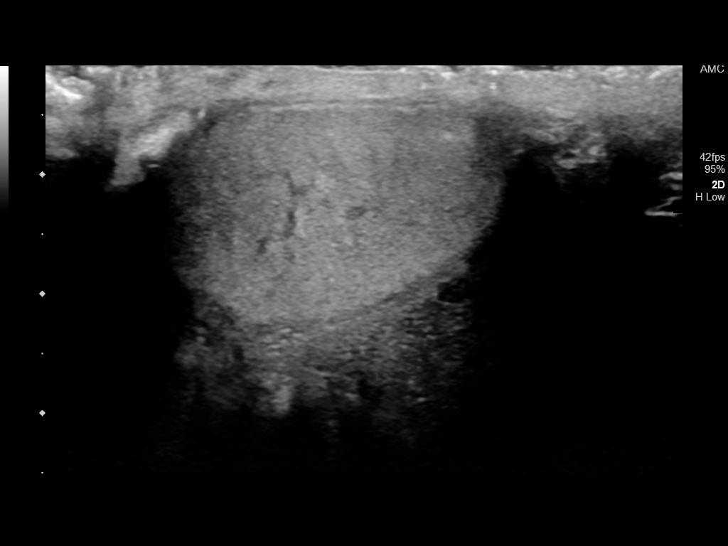
[im 9/33]
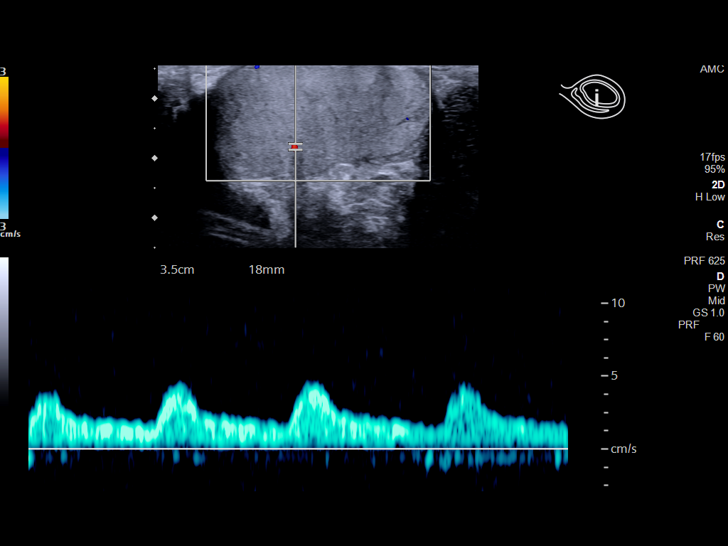
[im 11/33]
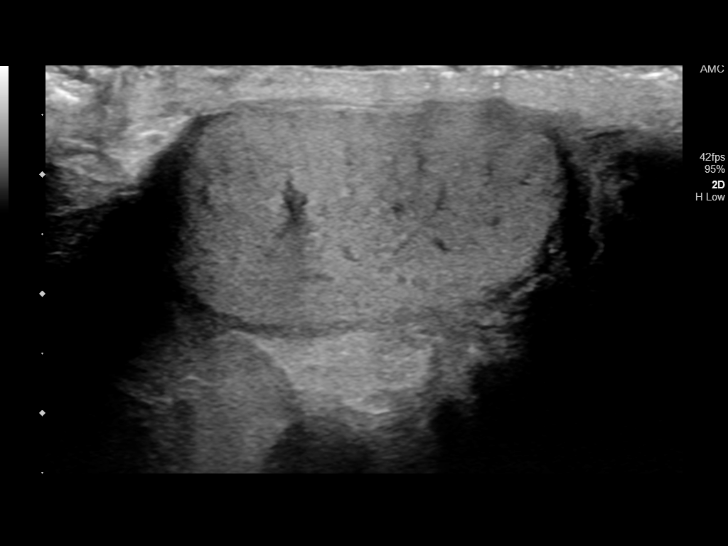
[im 14/33]
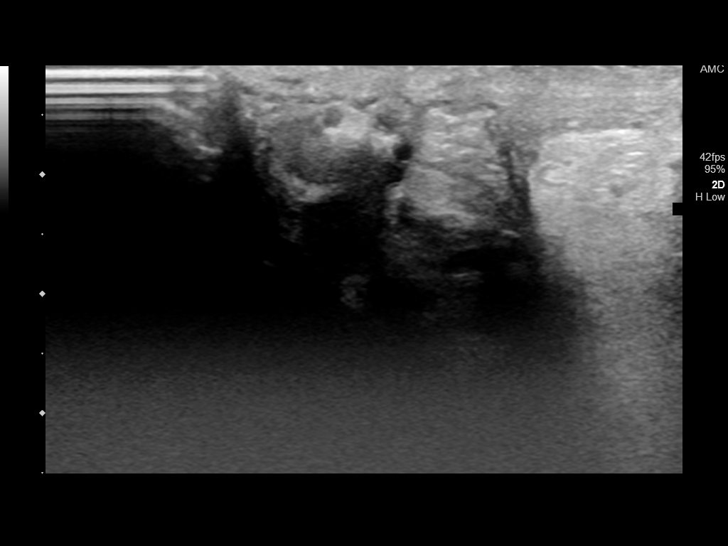
[im 17/33]
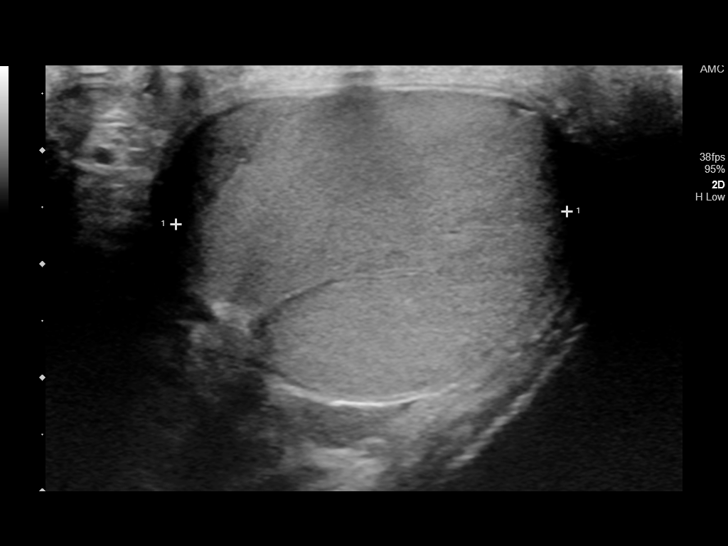
[im 19/33]
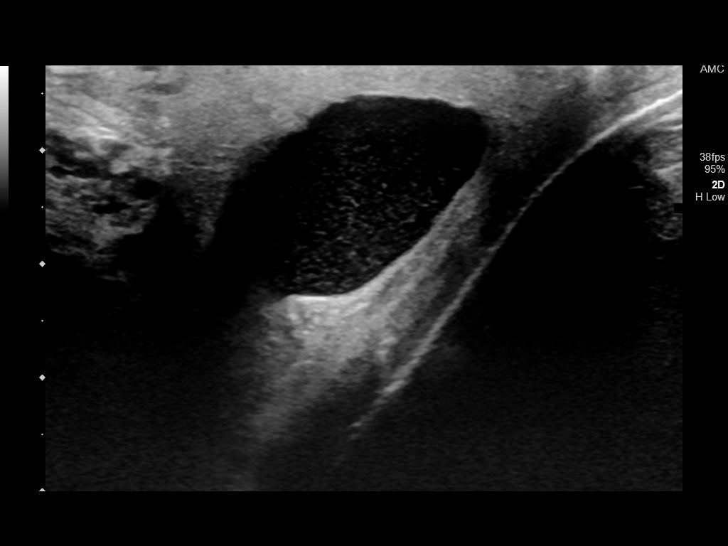
[im 22/33]
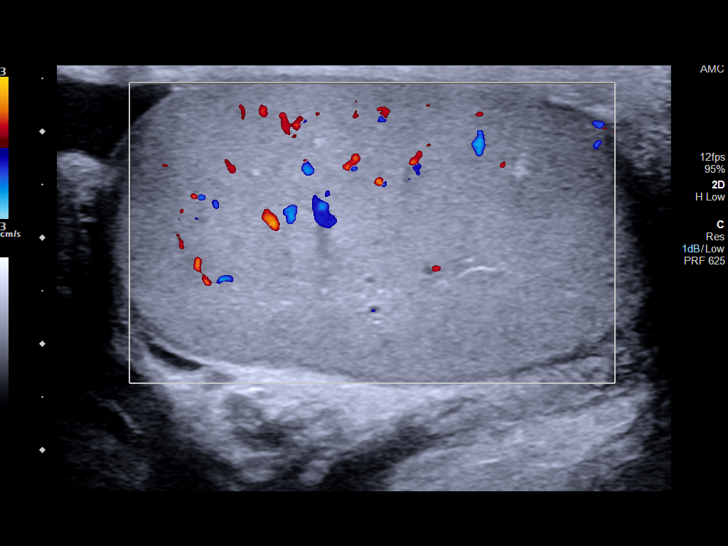
[im 25/33]
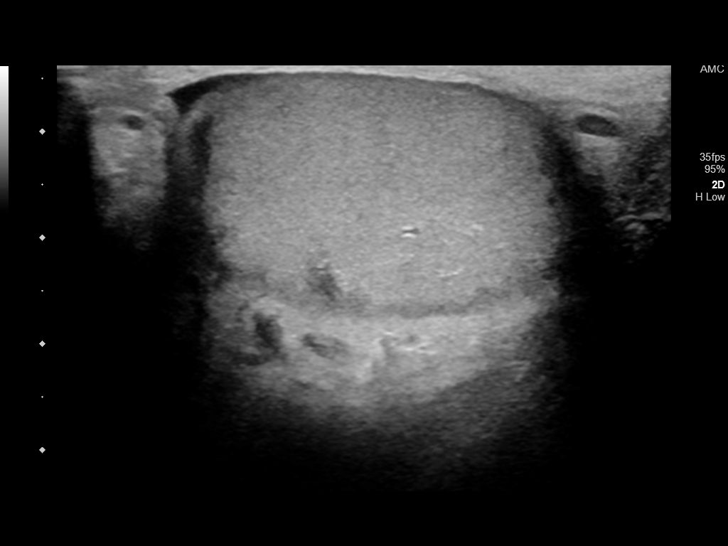
[im 27/33]
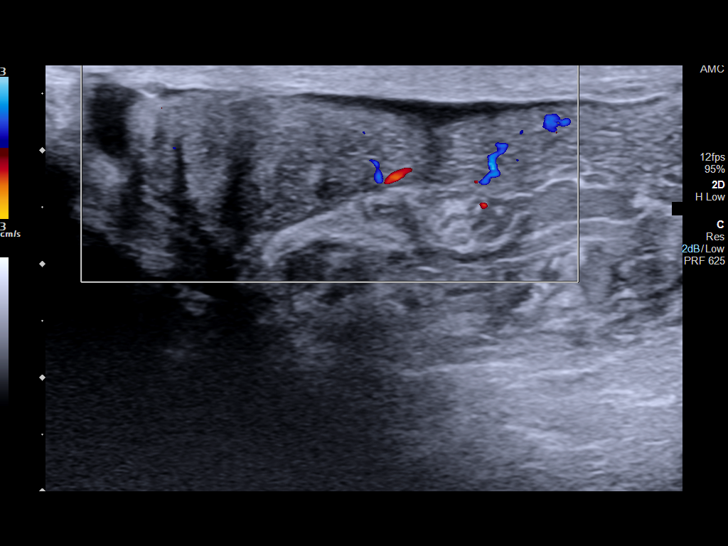
[im 30/33]
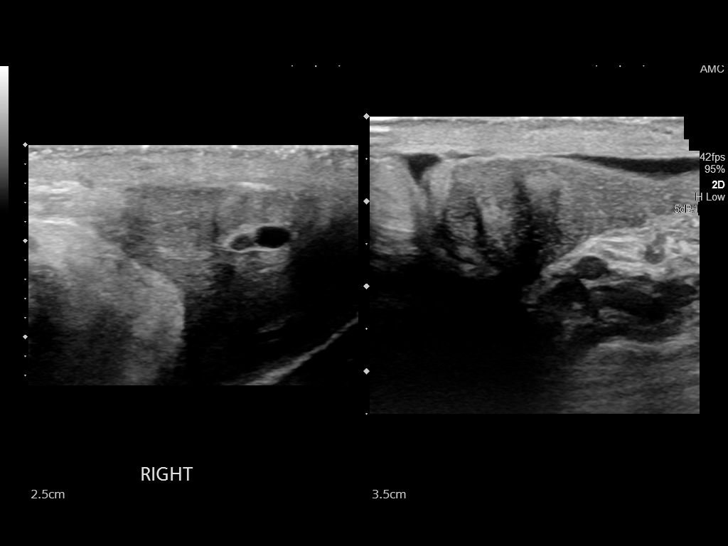
[im 33/33]
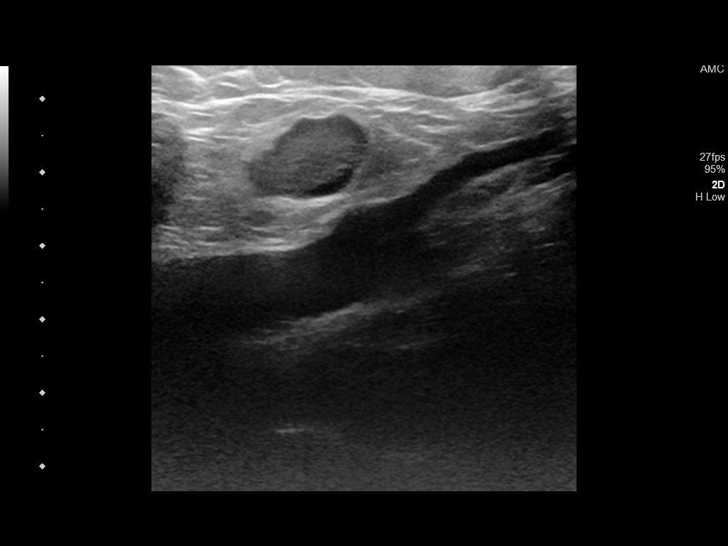

[13 of 25 positions shown; findings below may reference images not displayed]

FINDINGS: Right testicle

Measurements: 3.8 x 1.7 x 2.2 cm. No mass or microlithiasis
visualized.

Left testicle

Measurements: 5.4 x 3.0 x 3.4 cm. No mass or microlithiasis
visualized.

Right epididymis:  Normal in size and appearance.

Left epididymis:  5 mm cyst on the left epididymis.

Hydrocele:  Small left hydrocele.  Small left varicocele.

Varicocele:  None visualized.

Pulsed Doppler interrogation of both testes demonstrates normal low
resistance arterial and venous waveforms bilaterally.

Incidental note is made of multiple small bilateral inguinal lymph
nodes with fatty hila, likely reactive.
IMPRESSION: Asymmetric size of the testicles. However, there is no evidence of
the left testicular mass or other significant abnormality of the
left testicle.

Minimal left hydrocele.  Minimal left varicocele.

## 2021-09-27 DIAGNOSIS — M48061 Spinal stenosis, lumbar region without neurogenic claudication: Secondary | ICD-10-CM | POA: Diagnosis not present

## 2021-09-27 DIAGNOSIS — M5126 Other intervertebral disc displacement, lumbar region: Secondary | ICD-10-CM | POA: Diagnosis not present

## 2021-09-27 DIAGNOSIS — M4807 Spinal stenosis, lumbosacral region: Secondary | ICD-10-CM | POA: Diagnosis not present

## 2021-09-30 ENCOUNTER — Encounter (INDEPENDENT_AMBULATORY_CARE_PROVIDER_SITE_OTHER): Payer: Self-pay | Admitting: Vascular Surgery

## 2021-09-30 ENCOUNTER — Ambulatory Visit (INDEPENDENT_AMBULATORY_CARE_PROVIDER_SITE_OTHER): Payer: Medicaid Other | Admitting: Vascular Surgery

## 2021-09-30 VITALS — BP 141/87 | HR 78 | Resp 16 | Wt >= 6400 oz

## 2021-09-30 DIAGNOSIS — I83813 Varicose veins of bilateral lower extremities with pain: Secondary | ICD-10-CM | POA: Diagnosis not present

## 2021-09-30 DIAGNOSIS — E119 Type 2 diabetes mellitus without complications: Secondary | ICD-10-CM

## 2021-09-30 DIAGNOSIS — I809 Phlebitis and thrombophlebitis of unspecified site: Secondary | ICD-10-CM

## 2021-09-30 DIAGNOSIS — I10 Essential (primary) hypertension: Secondary | ICD-10-CM

## 2021-09-30 NOTE — Progress Notes (Signed)
MRN : [id number]  Walter Parks is a 36 y.o. ([birth year]) male who presents with chief complaint of  Chief Complaint  Patient presents with   New Patient (Initial Visit)    Ref Ronnald Ramp consult bilateral asymptomatic varicose veins  .  History of Present Illness: Patient returns today in follow up on referral from his orthopedic surgeon.  He was seen last in 2021 with varicosities and pain.  He had been doing reasonably well until recently and he has noticed more prominent varicosities particular in the left lower extremity associated with intermittent swelling.  The patient reports abruptly waking up with numbness and pain down the left leg that took several hours to improve.  Given his previous history of thrombophlebitis, there was concern that this could be the cause of his issue.  He denies any chest pain or shortness of breath.  No ulceration or infection.  No fever or chills.  Current Outpatient Medications  Medication Sig Dispense Refill   calcium gluconate 500 MG tablet Take 1 tablet by mouth daily.     Multiple Vitamins-Minerals (MULTIVITAMIN WITH MINERALS) tablet Take 1 tablet by mouth daily.     No current facility-administered medications for this visit.    Past Medical History:  Diagnosis Date   Depression    Diabetes mellitus without complication (HCC)    GERD (gastroesophageal reflux disease)    Hx of tobacco use, presenting hazards to health 06/25/2015   Hypertension    Obesity    Sleep apnea    on an APAP machine    Past Surgical History:  Procedure Laterality Date   LAPAROSCOPIC GASTRIC RESTRICTIVE DUODENAL PROCEDURE (DUODENAL SWITCH) N/A 11/02/2015   Procedure: LAPAROSCOPIC GASTRIC RESTRICTIVE DUODENAL PROCEDURE (DUODENAL SWITCH);  Surgeon: Ladora Daniel, MD;  Location: [facility] ORS;  Service: General;  Laterality: N/A;   TONSILLECTOMY     TONSILLECTOMY       Social History   Tobacco Use   Smoking status: Some Days    Packs/day: 0.00    Years: 18.00     Total pack years: 0.00    Types: Cigarettes    Last attempt to quit: 08/01/2015    Years since quitting: 6.1   Smokeless tobacco: Never  Vaping Use   Vaping Use: Never used  Substance Use Topics   Alcohol use: Yes    Alcohol/week: 0.0 standard drinks of alcohol    Comment: socially   Drug use: No      Family History  Problem Relation Age of Onset   Hypertension Mother    Hypertension Father    Diabetes Maternal Aunt    Hypertension Maternal Grandmother    Hypertension Maternal Grandfather    Stroke Paternal Grandmother    Hypertension Paternal Grandmother    Hypertension Paternal Grandfather    Diabetes Cousin    Heart disease Neg Hx    COPD Neg Hx      Allergies  Allergen Reactions   Flavoring Agent     Other reaction(s): edema   Lemon Flavor Hives   Lemon Oil Swelling     REVIEW OF SYSTEMS (Negative unless checked)  Constitutional: [] Weight loss  [] Fever  [] Chills Cardiac: [] Chest pain   [] Chest pressure   [] Palpitations   [] Shortness of breath when laying flat   [] Shortness of breath at rest   [] Shortness of breath with exertion. Vascular:  [] Pain in legs with walking   [] Pain in legs at rest   [] Pain in legs when laying flat   []   Claudication   [] Pain in feet when walking  [] Pain in feet at rest  [] Pain in feet when laying flat   [] History of DVT   [] Phlebitis   [x] Swelling in legs   [x] Varicose veins   [] Non-healing ulcers Pulmonary:   [] Uses home oxygen   [] Productive cough   [] Hemoptysis   [] Wheeze  [] COPD   [] Asthma Neurologic:  [] Dizziness  [] Blackouts   [] Seizures   [] History of stroke   [] History of TIA  [] Aphasia   [] Temporary blindness   [] Dysphagia   [] Weakness or numbness in arms   [x] Weakness or numbness in legs Musculoskeletal:  [] Arthritis   [] Joint swelling   [] Joint pain   [] Low back pain Hematologic:  [] Easy bruising  [] Easy bleeding   [] Hypercoagulable state   [] Anemic   Gastrointestinal:  [] Blood in stool   [] Vomiting blood  [] Gastroesophageal  reflux/heartburn   [] Abdominal pain Genitourinary:  [] Chronic kidney disease   [] Difficult urination  [] Frequent urination  [] Burning with urination   [] Hematuria Skin:  [] Rashes   [] Ulcers   [] Wounds Psychological:  [] History of anxiety   []  History of major depression.  Physical Examination  BP (!) 141/87 (BP Location: Left Arm)   Pulse 78   Resp 16   Wt (!) 411 lb (186.4 kg)   BMI 52.77 kg/m  Gen:  WD/WN, NAD. Morbidly obese Head: [place]/AT, No temporalis wasting. Ear/Nose/Throat: Hearing grossly intact, nares w/o erythema or drainage Eyes: Conjunctiva clear. Sclera non-icteric Neck: Supple.  Trachea midline Pulmonary:  Good air movement, no use of accessory muscles.  Cardiac: RRR, no JVD Vascular:  Vessel Right Left  Radial Palpable Palpable                          PT Palpable Palpable  DP Palpable Palpable   Gastrointestinal: soft, non-tender/non-distended. No guarding/reflex.  Musculoskeletal: M/S 5/5 throughout.  No deformity or atrophy.  Prominent varicosities more so on the left than the right.  Trace lower extremity edema. Neurologic: Sensation grossly intact in extremities.  Symmetrical.  Speech is fluent.  Psychiatric: Judgment intact, Mood & affect appropriate for pt's clinical situation. Dermatologic: No rashes or ulcers noted.  No cellulitis or open wounds.      Labs No results found for this or any previous visit (from the past 2160 hour(s)).  Radiology No results found.  Assessment/Plan Essential hypertension, benign blood pressure control important in reducing the progression of atherosclerotic disease. On appropriate oral medications.     Diabetes mellitus due to underlying condition with stage 1 chronic kidney disease, without long-term current use of insulin (HCC) blood glucose control important in reducing the progression of atherosclerotic disease. Also, involved in wound healing. On appropriate medications.     Phlebitis and  thrombophlebitis The patient has a previous history.  His orthopedic provider wants to ensure that he has not had recurrent thrombophlebitis which is a very reasonable concern.  Varicose veins of leg with pain, bilateral The patient is having worsening varicosities particularly in the left lower extremity.  These have become painful and tender to the touch again.  Given his previous history of venous insufficiency as well as thrombophlebitis, we will assess this with reflux study in the near future at his convenience.  Some of his symptoms sound more neuropathic with the numbness throughout the whole leg and the acute onset, but given his venous issues it is appropriate to check this as well.    , MD  09/30/2021 10:34  AM    This note was created with Dragon medical transcription system.  Any errors from dictation are purely unintentional

## 2021-09-30 NOTE — Assessment & Plan Note (Signed)
The patient is having worsening varicosities particularly in the left lower extremity.  These have become painful and tender to the touch again.  Given his previous history of venous insufficiency as well as thrombophlebitis, we will assess this with reflux study in the near future at his convenience.  Some of his symptoms sound more neuropathic with the numbness throughout the whole leg and the acute onset, but given his venous issues it is appropriate to check this as well.

## 2021-10-14 DIAGNOSIS — M5416 Radiculopathy, lumbar region: Secondary | ICD-10-CM | POA: Diagnosis not present

## 2021-11-01 DIAGNOSIS — M5416 Radiculopathy, lumbar region: Secondary | ICD-10-CM | POA: Diagnosis not present

## 2021-11-03 DIAGNOSIS — M5136 Other intervertebral disc degeneration, lumbar region: Secondary | ICD-10-CM | POA: Diagnosis not present

## 2021-11-03 DIAGNOSIS — M5416 Radiculopathy, lumbar region: Secondary | ICD-10-CM | POA: Diagnosis not present

## 2021-11-03 DIAGNOSIS — M545 Low back pain, unspecified: Secondary | ICD-10-CM | POA: Diagnosis not present

## 2021-11-03 DIAGNOSIS — M5126 Other intervertebral disc displacement, lumbar region: Secondary | ICD-10-CM | POA: Diagnosis not present

## 2021-11-29 ENCOUNTER — Ambulatory Visit (INDEPENDENT_AMBULATORY_CARE_PROVIDER_SITE_OTHER): Payer: Medicaid Other | Admitting: Vascular Surgery

## 2021-11-29 ENCOUNTER — Encounter (INDEPENDENT_AMBULATORY_CARE_PROVIDER_SITE_OTHER): Payer: Medicaid Other

## 2021-12-01 DIAGNOSIS — M545 Low back pain, unspecified: Secondary | ICD-10-CM | POA: Diagnosis not present

## 2021-12-01 DIAGNOSIS — M5416 Radiculopathy, lumbar region: Secondary | ICD-10-CM | POA: Diagnosis not present

## 2021-12-01 DIAGNOSIS — M5136 Other intervertebral disc degeneration, lumbar region: Secondary | ICD-10-CM | POA: Diagnosis not present

## 2021-12-01 DIAGNOSIS — M5126 Other intervertebral disc displacement, lumbar region: Secondary | ICD-10-CM | POA: Diagnosis not present

## 2022-01-16 ENCOUNTER — Encounter (INDEPENDENT_AMBULATORY_CARE_PROVIDER_SITE_OTHER): Payer: Self-pay

## 2022-09-17 IMAGING — US US EXTREM LOW VENOUS*L*
1 series · 13 of 24 positions shown · non-contrast
Comparison: None.

CLINICAL DATA: 34-year-old male with left lower extremity pain for
5 days.

EXAM:
LEFT LOWER EXTREMITY VENOUS DOPPLER ULTRASOUND
TECHNIQUE: Gray-scale sonography with graded compression, as well as color
Doppler and duplex ultrasound were performed to evaluate the left
lower extremity deep venous systems from the level of the common
femoral vein and including the common femoral, femoral, profunda
femoral, popliteal and calf veins including the posterior tibial,
peroneal and gastrocnemius veins when visible. Spectral Doppler was
utilized to evaluate flow at rest and with distal augmentation
maneuvers in the common femoral, femoral and popliteal veins. The
contralateral common femoral vein was also evaluated for comparison.

[Series 1: us extrem low venous*left* · 0.08mm/px · 13 of 33 slices shown]
[im 1/33]
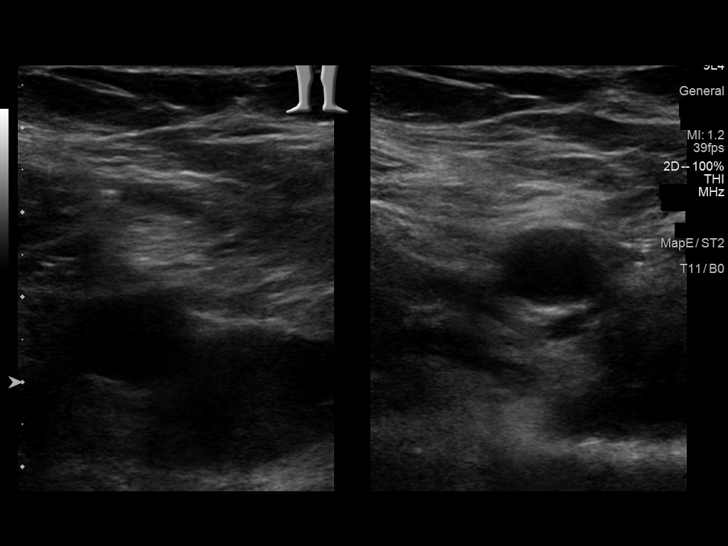
[im 3/33]
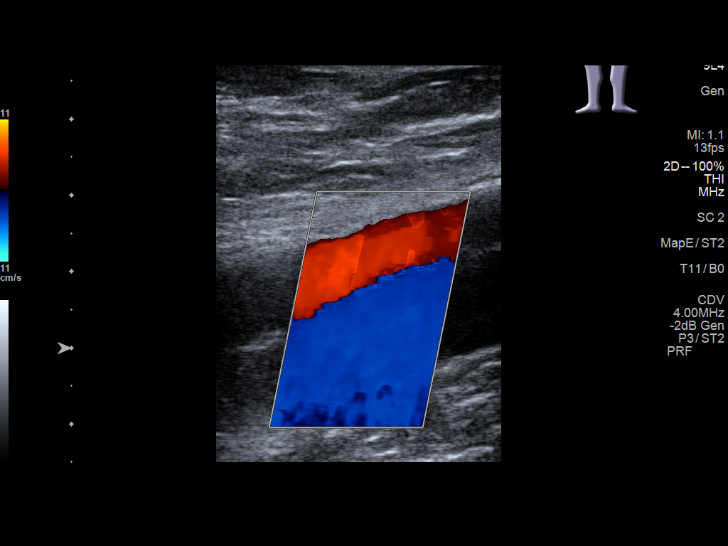
[im 6/33]
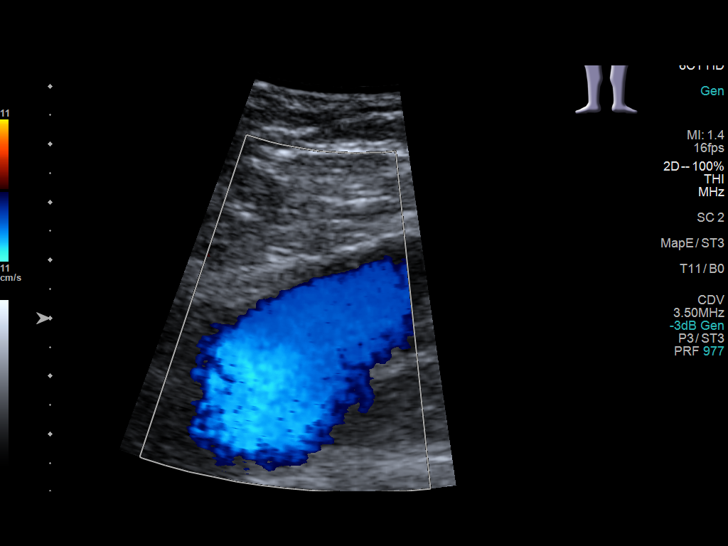
[im 9/33]
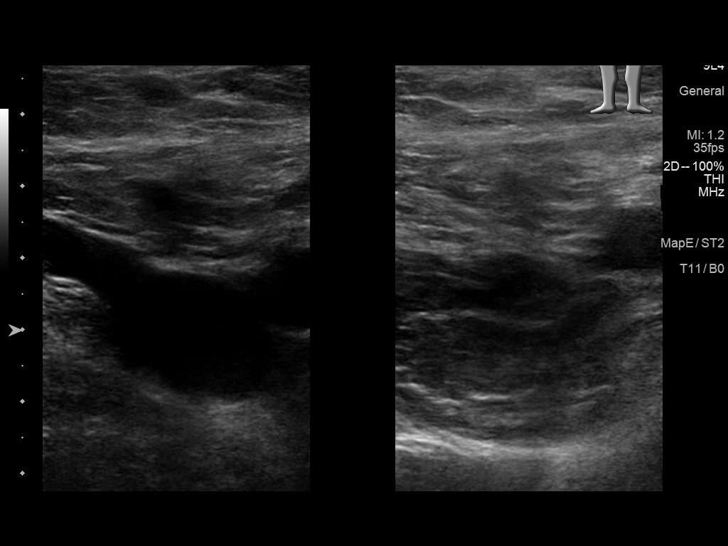
[im 12/33]
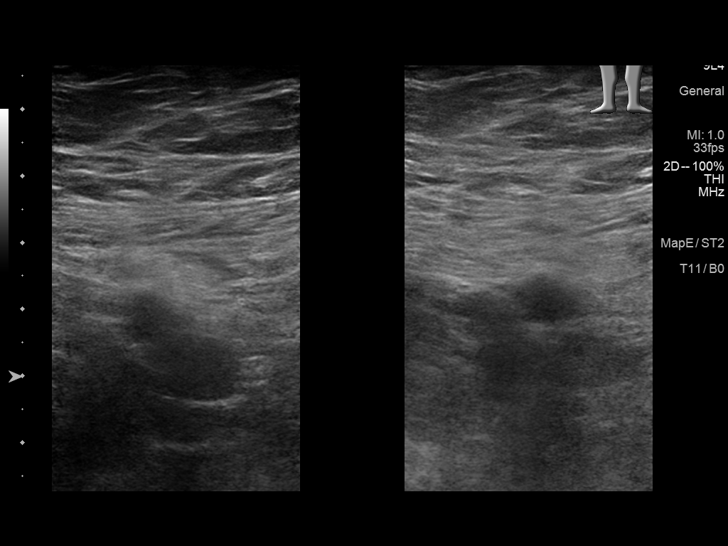
[im 14/33]
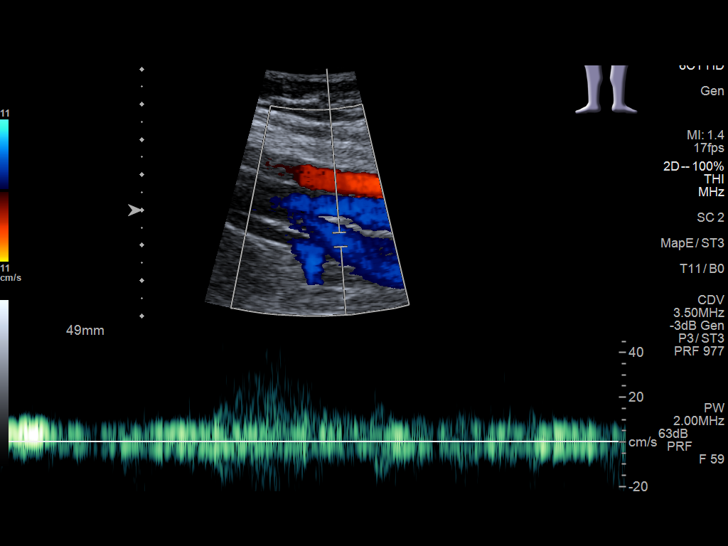
[im 17/33]
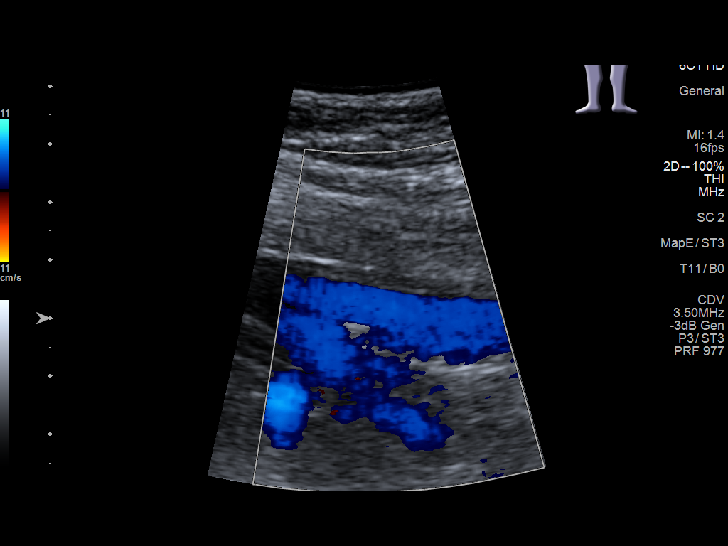
[im 19/33]
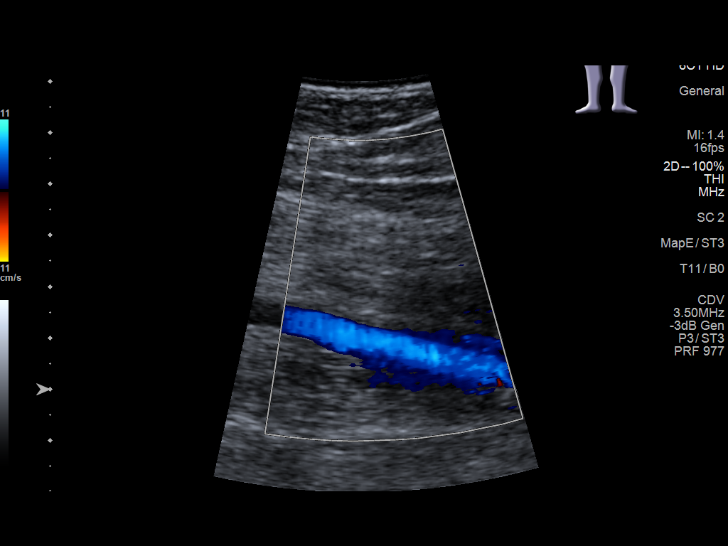
[im 21/33]
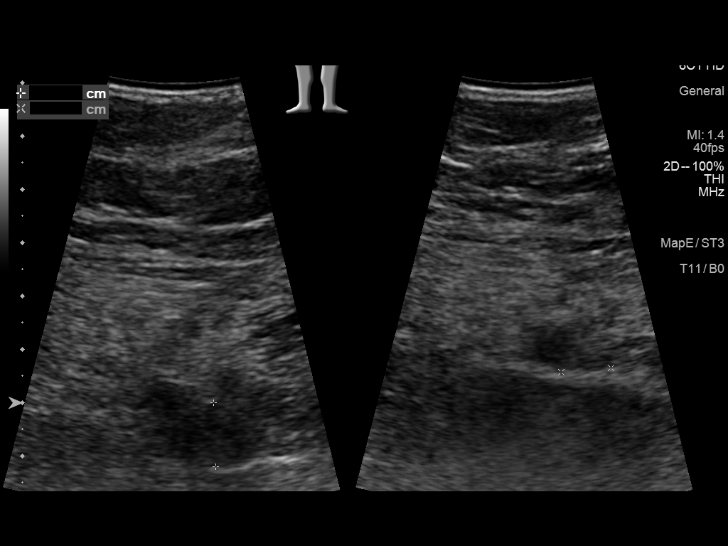
[im 24/33]
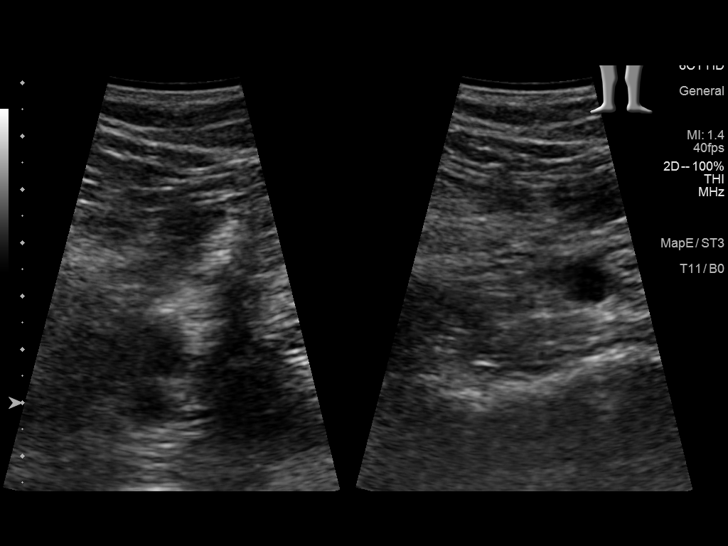
[im 27/33]
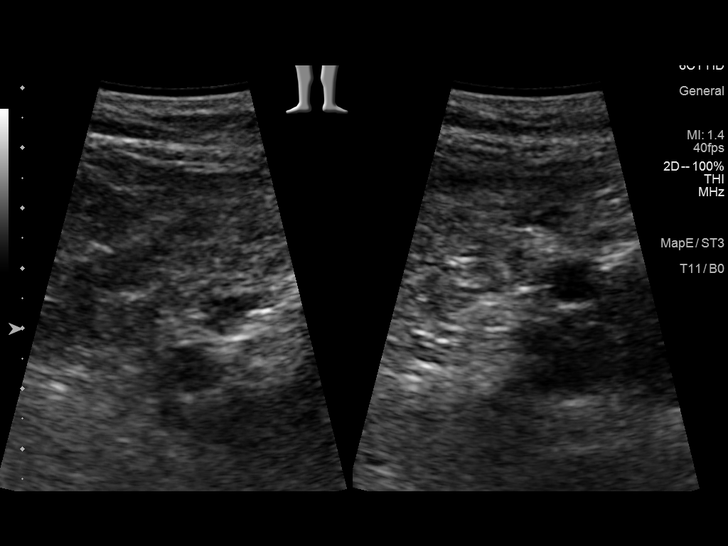
[im 30/33]
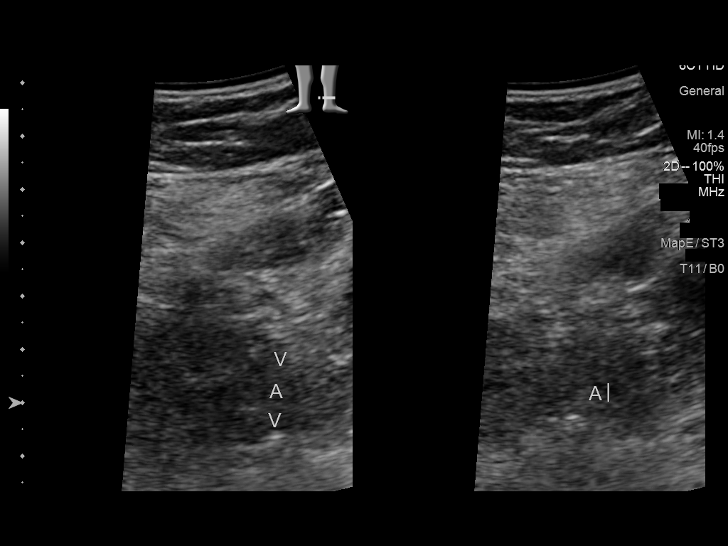
[im 33/33]
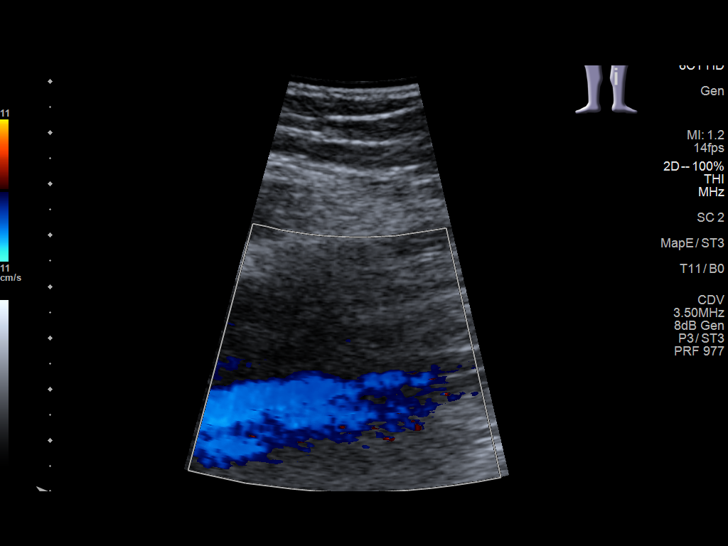

[13 of 24 positions shown; findings below may reference images not displayed]

FINDINGS: LEFT LOWER EXTREMITY

Common Femoral Vein: No evidence of thrombus. Normal
compressibility, respiratory phasicity and response to augmentation.

Central Greater Saphenous Vein: No evidence of thrombus. Normal
compressibility and flow on color Doppler imaging.

Central Profunda Femoral Vein: No evidence of thrombus. Normal
compressibility and flow on color Doppler imaging.

Femoral Vein: No evidence of thrombus. Normal compressibility,
respiratory phasicity and response to augmentation.

Popliteal Vein: No evidence of thrombus. Normal compressibility,
respiratory phasicity and response to augmentation.

Calf Veins: No evidence of thrombus. Normal compressibility and flow
on color Doppler imaging.

Other Findings:  None.

RIGHT LOWER EXTREMITY

Common Femoral Vein: No evidence of thrombus. Normal
compressibility, respiratory phasicity and response to augmentation.
IMPRESSION: No evidence of left lower extremity deep venous thrombosis.

## 2022-11-22 DIAGNOSIS — S43401A Unspecified sprain of right shoulder joint, initial encounter: Secondary | ICD-10-CM | POA: Diagnosis not present

## 2022-12-28 ENCOUNTER — Ambulatory Visit: Payer: Medicaid Other | Admitting: Nurse Practitioner

## 2023-01-01 ENCOUNTER — Ambulatory Visit: Payer: Self-pay | Admitting: Family Medicine

## 2023-01-01 ENCOUNTER — Encounter: Payer: Self-pay | Admitting: Physician Assistant

## 2023-01-01 ENCOUNTER — Ambulatory Visit: Payer: Medicaid Other | Admitting: Physician Assistant

## 2023-01-01 VITALS — BP 174/114 | HR 85 | Temp 98.2°F | Resp 16 | Ht 73.0 in | Wt >= 6400 oz

## 2023-01-01 DIAGNOSIS — E0822 Diabetes mellitus due to underlying condition with diabetic chronic kidney disease: Secondary | ICD-10-CM

## 2023-01-01 DIAGNOSIS — I10 Essential (primary) hypertension: Secondary | ICD-10-CM | POA: Diagnosis not present

## 2023-01-01 DIAGNOSIS — N181 Chronic kidney disease, stage 1: Secondary | ICD-10-CM | POA: Diagnosis not present

## 2023-01-01 DIAGNOSIS — E119 Type 2 diabetes mellitus without complications: Secondary | ICD-10-CM

## 2023-01-01 MED ORDER — AMLODIPINE BESYLATE 5 MG PO TABS: 5.0000 mg | ORAL_TABLET | Freq: Every day | ORAL | 0 refills | Status: DC

## 2023-01-01 NOTE — Progress Notes (Signed)
Acute Office Visit   Patient: Walter Parks   DOB: 05-11-1985   37 y.o. Male  MRN: [id number] Visit Date: 01/01/2023  Today's healthcare provider: Oswaldo Conroy Ivie Maese, PA-C  Introduced myself to the patient as a Secondary school teacher and provided education on APPs in clinical practice.    Chief Complaint  Patient presents with   Hypertension    Pt here for job clearance needs to have bp under control.   Subjective    HPI HPI     Hypertension    Additional comments: Pt here for job clearance needs to have bp under control.      Last edited by Forde Radon, CMA on 01/01/2023  1:22 PM.       Hypertension: - Medications: not taking medication at this time  - Compliance: unsure  - Checking BP at home: not checking - but reports he can check if needed - Denies any SOB, CP, vision changes, medication SEs, or symptoms of hypotension  He reports he does have some edema when he is on feet for prolonged periods of time   He reports he is sp bariatric surgery - in 2017  Reports he was able to come off a lot of his medications after losing the weight and has not been treated for chronic conditions for some time    Medications: Outpatient Medications Prior to Visit  Medication Sig   Multiple Vitamins-Minerals (MULTIVITAMIN WITH MINERALS) tablet Take 1 tablet by mouth daily.   calcium gluconate 500 MG tablet Take 1 tablet by mouth daily. (Patient not taking: Reported on 01/01/2023)   No facility-administered medications prior to visit.    Review of Systems  Constitutional:  Negative for chills, fatigue and fever.  Eyes:  Negative for visual disturbance.  Respiratory:  Negative for shortness of breath and wheezing.   Cardiovascular:  Negative for chest pain, palpitations and leg swelling.  Gastrointestinal:  Negative for diarrhea and vomiting.  Neurological:  Negative for dizziness, facial asymmetry, light-headedness, numbness and headaches.        Objective    BP (!)  174/114   Pulse 85   Temp 98.2 F (36.8 C) (Oral)   Resp 16   Ht 6\' 1"  (1.854 m)   Wt (!) 412 lb 4.8 oz (187 kg)   SpO2 97%   BMI 54.40 kg/m     Physical Exam Vitals reviewed.  Constitutional:      General: He is awake.     Appearance: Normal appearance. He is well-developed and well-groomed.  HENT:     Head: Normocephalic and atraumatic.  Cardiovascular:     Rate and Rhythm: Normal rate and regular rhythm.     Pulses: Normal pulses.          Radial pulses are 2+ on the right side and 2+ on the left side.     Heart sounds: Normal heart sounds. No murmur heard.    No friction rub. No gallop.  Pulmonary:     Effort: Pulmonary effort is normal.     Breath sounds: Normal breath sounds. No decreased air movement. No decreased breath sounds, wheezing, rhonchi or rales.  Musculoskeletal:     Right lower leg: No edema.     Left lower leg: No edema.  Neurological:     General: No focal deficit present.     Mental Status: He is alert and oriented to person, place, and time.     GCS: GCS eye subscore  is 4. GCS verbal subscore is 5. GCS motor subscore is 6.     Cranial Nerves: No cranial nerve deficit, dysarthria or facial asymmetry.  Psychiatric:        Attention and Perception: Attention and perception normal.        Mood and Affect: Mood and affect normal.        Speech: Speech normal.        Behavior: Behavior normal. Behavior is cooperative.        Thought Content: Thought content normal.        Cognition and Memory: Cognition and memory normal.        Judgment: Judgment normal.       No results found for any visits on 01/01/23.  Assessment & Plan      Return in about 2 weeks (around 01/15/2023) for HTN.       Problem List Items Addressed This Visit       Cardiovascular and Mediastinum   Essential hypertension, benign - Primary (Chronic)    Chronic, ongoing Patient has several documented instances of extremely elevated blood pressure.  He is not currently  taking any medications Will start amlodipine 5 mg p.o. daily for now with follow-up in 2 weeks  Recommend that he takes his blood pressure at home and keep a log for review at follow-up If further blood pressure control is needed at follow-up would likely need to start HCTZ or ACE/ARB Follow-up in 2 weeks or sooner if concerns arise      Relevant Medications   amLODipine (NORVASC) 5 MG tablet   Other Relevant Orders   COMPLETE METABOLIC PANEL WITH GFR   CBC w/Diff/Platelet   TSH     Endocrine   Type 2 diabetes mellitus (HCC) (Chronic)   Relevant Orders   HgB A1c   Lipid Profile   Diabetes mellitus due to underlying condition with stage 1 chronic kidney disease, without long-term current use of insulin (HCC)    Unsure of status but this is likely chronic and ongoing Most recent A1c is from 2017 and was 5.7 Recheck today, results to dictate further management Will discuss results further at follow up - likely need to have every 3-6 months depending on control        Other   Morbid obesity (HCC)    Chronic, ongoing concern Patient is s/p bariatric surgery-2017 Unsure of management since this time Will likely need bariatric lab panel for monitoring Recommend follow-up in about 3 months for monitoring and further assessment      Relevant Orders   CBC w/Diff/Platelet   Lipid Profile     Return in about 2 weeks (around 01/15/2023) for HTN.   I, Antasia Haider E Gunther Zawadzki, PA-C, have reviewed all documentation for this visit. The documentation on 01/01/23 for the exam, diagnosis, procedures, and orders are all accurate and complete.   Jacquelin Hawking, MHS, PA-C [facility]

## 2023-01-01 NOTE — Patient Instructions (Addendum)
Please try to wear compression stockings. Use your shoe size and then measure around the widest part of your calf to get the appropriate size stocking I typically recommend 15-25 mmHg compression force for daily wear. Please put these on first thing in the morning and wear until the evening time.  These should not be painful or cut into your legs but they may be pretty snug. If they cause pain, please take them off.   Your blood pressure was mildly elevated today.  If possible please take it at home using an electronic blood pressure cuff for the upper arm Record your blood pressure once per day and bring them back with you to your apt so we can make sure you are not developing high blood pressure.   It was nice to meet you and I appreciate the opportunity to be involved in your care If you were satisfied with the care you received from me, I would greatly appreciate you saying so in the after-visit survey that is sent out following our visit.

## 2023-01-01 NOTE — Assessment & Plan Note (Signed)
Chronic, ongoing concern Patient is s/p bariatric surgery-2017 Unsure of management since this time Will likely need bariatric lab panel for monitoring Recommend follow-up in about 3 months for monitoring and further assessment

## 2023-01-01 NOTE — Assessment & Plan Note (Signed)
Unsure of status but this is likely chronic and ongoing Most recent A1c is from 2017 and was 5.7 Recheck today, results to dictate further management Will discuss results further at follow up - likely need to have every 3-6 months depending on control

## 2023-01-01 NOTE — Assessment & Plan Note (Signed)
Chronic, ongoing Patient has several documented instances of extremely elevated blood pressure.  He is not currently taking any medications Will start amlodipine 5 mg p.o. daily for now with follow-up in 2 weeks  Recommend that he takes his blood pressure at home and keep a log for review at follow-up If further blood pressure control is needed at follow-up would likely need to start HCTZ or ACE/ARB Follow-up in 2 weeks or sooner if concerns arise

## 2023-01-02 LAB — HEMOGLOBIN A1C
Hgb A1c MFr Bld: 6 %{Hb} — ABNORMAL HIGH (ref <5.7)
Mean Plasma Glucose: 126 mg/dL
eAG (mmol/L): 7 mmol/L

## 2023-01-02 LAB — CBC WITH DIFFERENTIAL/PLATELET
Absolute Monocytes: 451 {cells}/uL (ref 200–950)
Basophils Absolute: 60 {cells}/uL (ref 0–200)
Basophils Relative: 0.7 %
Eosinophils Absolute: 51 {cells}/uL (ref 15–500)
Eosinophils Relative: 0.6 %
HCT: 50.8 % — ABNORMAL HIGH (ref 38.5–50.0)
Hemoglobin: 16.2 g/dL (ref 13.2–17.1)
Lymphs Abs: 3196 {cells}/uL (ref 850–3900)
MCH: 26.4 pg — ABNORMAL LOW (ref 27.0–33.0)
MCHC: 31.9 g/dL — ABNORMAL LOW (ref 32.0–36.0)
MCV: 82.7 fL (ref 80.0–100.0)
MPV: 9.1 fL (ref 7.5–12.5)
Monocytes Relative: 5.3 %
Neutro Abs: 4743 {cells}/uL (ref 1500–7800)
Neutrophils Relative %: 55.8 %
Platelets: 353 10*3/uL (ref 140–400)
RBC: 6.14 10*6/uL — ABNORMAL HIGH (ref 4.20–5.80)
RDW: 13.3 % (ref 11.0–15.0)
Total Lymphocyte: 37.6 %
WBC: 8.5 10*3/uL (ref 3.8–10.8)

## 2023-01-02 LAB — COMPLETE METABOLIC PANEL WITH GFR
AG Ratio: 1.3 (calc) (ref 1.0–2.5)
ALT: 15 U/L (ref 9–46)
AST: 18 U/L (ref 10–40)
Albumin: 4 g/dL (ref 3.6–5.1)
Alkaline phosphatase (APISO): 125 U/L (ref 36–130)
BUN: 10 mg/dL (ref 7–25)
CO2: 26 mmol/L (ref 20–32)
Calcium: 9.4 mg/dL (ref 8.6–10.3)
Chloride: 102 mmol/L (ref 98–110)
Creat: 0.94 mg/dL (ref 0.60–1.26)
Globulin: 3.1 g/dL (ref 1.9–3.7)
Glucose, Bld: 94 mg/dL (ref 65–99)
Potassium: 4.3 mmol/L (ref 3.5–5.3)
Sodium: 140 mmol/L (ref 135–146)
Total Bilirubin: 0.5 mg/dL (ref 0.2–1.2)
Total Protein: 7.1 g/dL (ref 6.1–8.1)
eGFR: 108 mL/min/{1.73_m2} (ref >=60)

## 2023-01-02 LAB — LIPID PANEL
Cholesterol: 161 mg/dL (ref <200)
HDL: 41 mg/dL (ref >=40)
LDL Cholesterol (Calc): 102 mg/dL — ABNORMAL HIGH
Non-HDL Cholesterol (Calc): 120 mg/dL (ref <130)
Total CHOL/HDL Ratio: 3.9 (calc) (ref <5.0)
Triglycerides: 87 mg/dL (ref <150)

## 2023-01-02 LAB — TSH: TSH: 1.2 m[IU]/L (ref 0.40–4.50)

## 2023-01-05 NOTE — Progress Notes (Signed)
Your lab results are back Your A1c was 6.0 which is great.  This is within goal, please continue your current regimen Overall your cholesterol looks pretty good.  Your LDL or "bad" cholesterol was mildly elevated.  We do prefer this to be less than 70 for folks with diabetes.  At this time I would recommend changes to your diet and regular exercise to help bring this down Your electrolytes, liver and kidney function were all in normal ranges Your thyroid testing was normal Your CBC shows some elevated red blood cell counts.  At this time I's suggest staying well-hydrated and we should keep an eye on this to make sure that there is not an underlying condition that we need to monitor further.  I recommend rechecking this in about 3 months. Please let us know if you have further questions or concerns

## 2023-01-09 NOTE — Progress Notes (Unsigned)
Acute Office Visit   Patient: Walter Parks   DOB: 1985/04/02   37 y.o. Male  MRN: [id number] Visit Date: 01/10/2023  Today's healthcare provider: Oswaldo Conroy Autymn Omlor, PA-C  Introduced myself to the patient as a Secondary school teacher and provided education on APPs in clinical practice.    No chief complaint on file.  Subjective    HPI   Hypertension:  -Medications: amlodipine 5 mg po every day  -Patient is compliant with above medications and reports no side effects. -Checking BP at home (average): *** -Highest BP at home: *** -Lowest BP at home: *** -Denies any SOB, CP, vision changes, LE edema or symptoms of hypotension -Diet: *** -Exercise: ***   Medications: Outpatient Medications Prior to Visit  Medication Sig   amLODipine (NORVASC) 5 MG tablet Take 1 tablet (5 mg total) by mouth daily.   calcium gluconate 500 MG tablet Take 1 tablet by mouth daily. (Patient not taking: Reported on 01/01/2023)   Multiple Vitamins-Minerals (MULTIVITAMIN WITH MINERALS) tablet Take 1 tablet by mouth daily.   No facility-administered medications prior to visit.    Review of Systems  {Insert previous labs (optional):23779} {See past labs  Heme  Chem  Endocrine  Serology  Results Review (optional):1}   Objective    There were no vitals taken for this visit. {Insert last BP/Wt (optional):23777}{See vitals history (optional):1}   Physical Exam    No results found for any visits on 01/10/23.  Assessment & Plan      No follow-ups on file.

## 2023-01-10 ENCOUNTER — Encounter: Payer: Self-pay | Admitting: Physician Assistant

## 2023-01-10 ENCOUNTER — Ambulatory Visit: Payer: Medicaid Other | Admitting: Physician Assistant

## 2023-01-10 DIAGNOSIS — I10 Essential (primary) hypertension: Secondary | ICD-10-CM | POA: Diagnosis not present

## 2023-01-10 MED ORDER — AMLODIPINE BESYLATE 10 MG PO TABS: 10.0000 mg | ORAL_TABLET | Freq: Every day | ORAL | 1 refills | Status: DC

## 2023-01-10 MED ORDER — HYDROCHLOROTHIAZIDE 12.5 MG PO TABS: 12.5000 mg | ORAL_TABLET | Freq: Every day | ORAL | 1 refills | Status: DC

## 2023-01-10 NOTE — Assessment & Plan Note (Signed)
Chronic, ongoing  BP remains elevated in office today - reduced to 148/82 on recheck He is not able to routinely check BP at home  Will increased Amlodipine to 10 mg PO every day and add hydrochlorothiazide 12.5 mg PO every day to regimen at this time If not improved to goal at follow up, will need to add ACEi or ARB for further control  Recommend follow up in about 2 weeks for monitoring

## 2023-01-10 NOTE — Patient Instructions (Signed)
I am increasing your Amlodipine to 10 mg - you can start taking 2 of your 5 mg tablets until those run out and then you can start the 10 mg once per day  I have also sent in a script for a medication called Hydrochlorothiazide 12.5 mg for you to take in addition to the Amlodipine to help control your BP  This second medication may make you urinate more but it is important to stay well hydrated while taking it  We will see you back in about 10 days to see how you are doing on BP control Please try to take your BP at home for monitoring and bring in a log of your results for [place] to review

## 2023-01-16 ENCOUNTER — Ambulatory Visit: Payer: Medicaid Other | Admitting: Physician Assistant

## 2023-01-22 NOTE — Progress Notes (Deleted)
Established Patient Office Visit  Name: SUPREME RYBARCZYK   MRN: [id number]    DOB: [birth year]   Date:01/22/2023  Today's Provider: Jacquelin Hawking, MHS, PA-C Introduced myself to the patient as a PA-C and provided education on APPs in clinical practice.         Subjective  Chief Complaint  No chief complaint on file.   HPI  Hypertension: - Medications: Amlodipine 10 mg PO every day, hydrochlorothiazide 12.5 mg PO every day  - Compliance: Great compliance  - Checking BP at home: *** - Denies any SOB, CP, vision changes, LE edema, medication SEs, or symptoms of hypotension    Patient Active Problem List   Diagnosis Date Noted   Lumbar radiculopathy 08/26/2021   Low HDL (under 40) 03/04/2020   Elevated hemoglobin A1c 03/04/2020   Pain in limb 02/17/2020   Varicose veins of leg with pain, bilateral 02/17/2020   Phlebitis and thrombophlebitis 02/03/2020   Diabetes mellitus due to underlying condition with stage 1 chronic kidney disease, without long-term current use of insulin (HCC) 06/25/2015   Hx of tobacco use, presenting hazards to health 06/25/2015   OSA (obstructive sleep apnea) 06/25/2015   Moderate smoker (20 or less per day) 05/26/2015   Medication monitoring encounter 04/19/2015   Type 2 diabetes mellitus (HCC) 12/16/2014   Essential hypertension, benign 12/16/2014   Morbid obesity (HCC) 12/16/2014   Sleep apnea 12/16/2014   Acid reflux 12/16/2014    Past Surgical History:  Procedure Laterality Date   LAPAROSCOPIC GASTRIC RESTRICTIVE DUODENAL PROCEDURE (DUODENAL SWITCH) N/A 11/02/2015   Procedure: LAPAROSCOPIC GASTRIC RESTRICTIVE DUODENAL PROCEDURE (DUODENAL SWITCH);  Surgeon: Everette Rank, MD;  Location: [facility] ORS;  Service: General;  Laterality: N/A;   TONSILLECTOMY     TONSILLECTOMY      Family History  Problem Relation Age of Onset   Hypertension Mother    Hypertension Father    Diabetes Maternal Aunt    Hypertension Maternal Grandmother     Hypertension Maternal Grandfather    Stroke Paternal Grandmother    Hypertension Paternal Grandmother    Hypertension Paternal Grandfather    Diabetes Cousin    Heart disease Neg Hx    COPD Neg Hx     Social History   Tobacco Use   Smoking status: Some Days    Current packs/day: 0.00    Types: Cigarettes    Last attempt to quit: 07/31/1997    Years since quitting: 25.4   Smokeless tobacco: Never  Substance Use Topics   Alcohol use: Yes    Alcohol/week: 0.0 standard drinks of alcohol    Comment: socially     Current Outpatient Medications:    amLODipine (NORVASC) 10 MG tablet, Take 1 tablet (10 mg total) by mouth daily., Disp: 30 tablet, Rfl: 1   calcium gluconate 500 MG tablet, Take 1 tablet by mouth daily., Disp: , Rfl:    hydrochlorothiazide (HYDRODIURIL) 12.5 MG tablet, Take 1 tablet (12.5 mg total) by mouth daily., Disp: 30 tablet, Rfl: 1   Multiple Vitamins-Minerals (MULTIVITAMIN WITH MINERALS) tablet, Take 1 tablet by mouth daily., Disp: , Rfl:   Allergies  Allergen Reactions   Flavoring Agent     Other reaction(s): edema   Lemon Flavor Hives   Lemon Oil Swelling    I personally reviewed active problem list, medication list, allergies, health maintenance, notes from last encounter, lab results with the patient/caregiver today.   ROS    Objective  There  were no vitals filed for this visit.  There is no height or weight on file to calculate BMI.  Physical Exam   Recent Results (from the past 2160 hour(s))  COMPLETE METABOLIC PANEL WITH GFR     Status: None   Collection Time: 01/01/23  1:44 PM  Result Value Ref Range   Glucose, Bld 94 65 - 99 mg/dL    Comment: .            Fasting reference interval .    BUN 10 7 - 25 mg/dL   Creat 1.61 0.96 - 0.45 mg/dL   eGFR 409 > OR = 60 WJ/XBJ/4.78G9   BUN/Creatinine Ratio SEE NOTE: 6 - 22 (calc)    Comment:    Not Reported: BUN and Creatinine are within    reference range. .    Sodium 140 135 - 146  mmol/L   Potassium 4.3 3.5 - 5.3 mmol/L   Chloride 102 98 - 110 mmol/L   CO2 26 20 - 32 mmol/L   Calcium 9.4 8.6 - 10.3 mg/dL   Total Protein 7.1 6.1 - 8.1 g/dL   Albumin 4.0 3.6 - 5.1 g/dL   Globulin 3.1 1.9 - 3.7 g/dL (calc)   AG Ratio 1.3 1.0 - 2.5 (calc)   Total Bilirubin 0.5 0.2 - 1.2 mg/dL   Alkaline phosphatase (APISO) 125 36 - 130 U/L   AST 18 10 - 40 U/L   ALT 15 9 - 46 U/L  CBC w/Diff/Platelet     Status: Abnormal   Collection Time: 01/01/23  1:44 PM  Result Value Ref Range   WBC 8.5 3.8 - 10.8 Thousand/uL   RBC 6.14 (H) 4.20 - 5.80 Million/uL   Hemoglobin 16.2 13.2 - 17.1 g/dL   HCT 56.2 (H) 13.0 - 86.5 %   MCV 82.7 80.0 - 100.0 fL   MCH 26.4 (L) 27.0 - 33.0 pg   MCHC 31.9 (L) 32.0 - 36.0 g/dL    Comment: For adults, a slight decrease in the calculated MCHC value (in the range of 30 to 32 g/dL) is most likely not clinically significant; however, it should be interpreted with caution in correlation with other red cell parameters and the patient's clinical condition.    RDW 13.3 11.0 - 15.0 %   Platelets 353 140 - 400 Thousand/uL   MPV 9.1 7.5 - 12.5 fL   Neutro Abs 4,743 1,500 - 7,800 cells/uL   Lymphs Abs 3,196 850 - 3,900 cells/uL   Absolute Monocytes 451 200 - 950 cells/uL   Eosinophils Absolute 51 15 - 500 cells/uL   Basophils Absolute 60 0 - 200 cells/uL   Neutrophils Relative % 55.8 %   Total Lymphocyte 37.6 %   Monocytes Relative 5.3 %   Eosinophils Relative 0.6 %   Basophils Relative 0.7 %  HgB A1c     Status: Abnormal   Collection Time: 01/01/23  1:44 PM  Result Value Ref Range   Hgb A1c MFr Bld 6.0 (H) <5.7 % of total Hgb    Comment: For someone without known diabetes, a hemoglobin  A1c value between 5.7% and 6.4% is consistent with prediabetes and should be confirmed with a  follow-up test. . For someone with known diabetes, a value <7% indicates that their diabetes is well controlled. A1c targets should be individualized based on duration  of diabetes, age, comorbid conditions, and other considerations. . This assay result is consistent with an increased risk of diabetes. . Currently, no consensus exists regarding  use of hemoglobin A1c for diagnosis of diabetes for children. .    Mean Plasma Glucose 126 mg/dL   eAG (mmol/L) 7.0 mmol/L  Lipid Profile     Status: Abnormal   Collection Time: 01/01/23  1:44 PM  Result Value Ref Range   Cholesterol 161 <200 mg/dL   HDL 41 > OR = 40 mg/dL   Triglycerides 87 <960 mg/dL   LDL Cholesterol (Calc) 102 (H) mg/dL (calc)    Comment: Reference range: <100 . Desirable range <100 mg/dL for primary prevention;   <70 mg/dL for patients with CHD or diabetic patients  with > or = 2 CHD risk factors. Marland Kitchen LDL-C is now calculated using the Martin-Hopkins  calculation, which is a validated novel method providing  better accuracy than the Friedewald equation in the  estimation of LDL-C.  Horald Pollen et al. Lenox Ahr. 4540;981(19): 2061-2068  (http://education.[facility].com/faq/FAQ164)    Total CHOL/HDL Ratio 3.9 <5.0 (calc)   Non-HDL Cholesterol (Calc) 120 <130 mg/dL (calc)    Comment: For patients with diabetes plus 1 major ASCVD risk  factor, treating to a non-HDL-C goal of <100 mg/dL  (LDL-C of <14 mg/dL) is considered a therapeutic  option.   TSH     Status: None   Collection Time: 01/01/23  1:44 PM  Result Value Ref Range   TSH 1.20 0.40 - 4.50 mIU/L     PHQ2/9:    01/10/2023    8:22 AM 01/01/2023    1:12 PM 02/03/2020    8:46 AM 05/31/2018   10:47 AM 05/31/2016    9:24 AM  Depression screen PHQ 2/9  Decreased Interest 0 3 0 0 0  Down, Depressed, Hopeless 0 0 0 0 0  PHQ - 2 Score 0 3 0 0 0  Altered sleeping 0 0     Tired, decreased energy 0 0     Change in appetite 0 0     Feeling bad or failure about yourself  0 0     Trouble concentrating 0 0     Moving slowly or fidgety/restless 0 0     Suicidal thoughts 0 0     PHQ-9 Score 0 3     Difficult doing  work/chores Not difficult at all Not difficult at all         Fall Risk:    01/10/2023    8:22 AM 01/01/2023    1:12 PM 02/03/2020    8:46 AM 05/31/2018   10:47 AM 05/31/2016    9:24 AM  Fall Risk   Falls in the past year? 0 0 0 0 No  Number falls in past yr: 0 0 0 0   Injury with Fall? 0 0 0 0   Risk for fall due to : No Fall Risks No Fall Risks     Follow up Falls prevention discussed;Education provided;Falls evaluation completed Falls prevention discussed;Education provided;Falls evaluation completed         Functional Status Survey:      Assessment & Plan  Problem List Items Addressed This Visit       Cardiovascular and Mediastinum   Essential hypertension, benign - Primary (Chronic)     No follow-ups on file.   I, Shannelle Alguire E Rozlyn Yerby, PA-C, have reviewed all documentation for this visit. The documentation on 01/22/23 for the exam, diagnosis, procedures, and orders are all accurate and complete.   Jacquelin Hawking, MHS, PA-C [facility]

## 2023-01-23 ENCOUNTER — Ambulatory Visit: Payer: Medicaid Other | Admitting: Physician Assistant

## 2023-01-23 DIAGNOSIS — I10 Essential (primary) hypertension: Secondary | ICD-10-CM

## 2023-01-23 NOTE — Progress Notes (Unsigned)
There were no vitals taken for this visit.   Subjective:    Patient ID: Walter Parks, male    DOB: Apr 02, 1985, 37 y.o.   MRN: [id number]  HPI: Walter Parks is a 37 y.o. male  No chief complaint on file.  Hypertension:  -Medications: *** -Patient is compliant with above medications and reports no side effects. -Checking BP at home (average): *** -Highest BP at home: *** -Lowest BP at home: *** -Denies any SOB, CP, vision changes, LE edema or symptoms of hypotension -Diet: *** -Exercise: ***   Relevant past medical, surgical, family and social history reviewed and updated as indicated. Interim medical history since our last visit reviewed. Allergies and medications reviewed and updated.  Review of Systems  Constitutional: Negative for fever or weight change.  Respiratory: Negative for cough and shortness of breath.   Cardiovascular: Negative for chest pain or palpitations.  Gastrointestinal: Negative for abdominal pain, no bowel changes.  Musculoskeletal: Negative for gait problem or joint swelling.  Skin: Negative for rash.  Neurological: Negative for dizziness or headache.  No other specific complaints in a complete review of systems (except as listed in HPI above).      Objective:    There were no vitals taken for this visit.  Wt Readings from Last 3 Encounters:  01/10/23 (!) 412 lb (186.9 kg)  01/01/23 (!) 412 lb 4.8 oz (187 kg)  09/30/21 (!) 411 lb (186.4 kg)    Physical Exam  Constitutional: Patient appears well-developed and well-nourished. Obese *** No distress.  HEENT: head atraumatic, normocephalic, pupils equal and reactive to light, ears ***, neck supple, throat within normal limits Cardiovascular: Normal rate, regular rhythm and normal heart sounds.  No murmur heard. No BLE edema. Pulmonary/Chest: Effort normal and breath sounds normal. No respiratory distress. Abdominal: Soft.  There is no tenderness. Psychiatric: Patient has a normal mood and affect.  behavior is normal. Judgment and thought content normal.  Results for orders placed or performed in visit on 01/01/23  COMPLETE METABOLIC PANEL WITH GFR  Result Value Ref Range   Glucose, Bld 94 65 - 99 mg/dL   BUN 10 7 - 25 mg/dL   Creat 4.09 8.11 - 9.14 mg/dL   eGFR 782 > OR = 60 NF/AOZ/3.08M5   BUN/Creatinine Ratio SEE NOTE: 6 - 22 (calc)   Sodium 140 135 - 146 mmol/L   Potassium 4.3 3.5 - 5.3 mmol/L   Chloride 102 98 - 110 mmol/L   CO2 26 20 - 32 mmol/L   Calcium 9.4 8.6 - 10.3 mg/dL   Total Protein 7.1 6.1 - 8.1 g/dL   Albumin 4.0 3.6 - 5.1 g/dL   Globulin 3.1 1.9 - 3.7 g/dL (calc)   AG Ratio 1.3 1.0 - 2.5 (calc)   Total Bilirubin 0.5 0.2 - 1.2 mg/dL   Alkaline phosphatase (APISO) 125 36 - 130 U/L   AST 18 10 - 40 U/L   ALT 15 9 - 46 U/L  CBC w/Diff/Platelet  Result Value Ref Range   WBC 8.5 3.8 - 10.8 Thousand/uL   RBC 6.14 (H) 4.20 - 5.80 Million/uL   Hemoglobin 16.2 13.2 - 17.1 g/dL   HCT 78.4 (H) 69.6 - 29.5 %   MCV 82.7 80.0 - 100.0 fL   MCH 26.4 (L) 27.0 - 33.0 pg   MCHC 31.9 (L) 32.0 - 36.0 g/dL   RDW 28.4 13.2 - 44.0 %   Platelets 353 140 - 400 Thousand/uL   MPV 9.1 7.5 -  12.5 fL   Neutro Abs 4,743 1,500 - 7,800 cells/uL   Lymphs Abs 3,196 850 - 3,900 cells/uL   Absolute Monocytes 451 200 - 950 cells/uL   Eosinophils Absolute 51 15 - 500 cells/uL   Basophils Absolute 60 0 - 200 cells/uL   Neutrophils Relative % 55.8 %   Total Lymphocyte 37.6 %   Monocytes Relative 5.3 %   Eosinophils Relative 0.6 %   Basophils Relative 0.7 %  HgB A1c  Result Value Ref Range   Hgb A1c MFr Bld 6.0 (H) <5.7 % of total Hgb   Mean Plasma Glucose 126 mg/dL   eAG (mmol/L) 7.0 mmol/L  Lipid Profile  Result Value Ref Range   Cholesterol 161 <200 mg/dL   HDL 41 > OR = 40 mg/dL   Triglycerides 87 <161 mg/dL   LDL Cholesterol (Calc) 102 (H) mg/dL (calc)   Total CHOL/HDL Ratio 3.9 <5.0 (calc)   Non-HDL Cholesterol (Calc) 120 <130 mg/dL (calc)  TSH  Result Value Ref Range    TSH 1.20 0.40 - 4.50 mIU/L      Assessment & Plan:   Problem List Items Addressed This Visit   None    Follow up plan: No follow-ups on file.

## 2023-01-24 ENCOUNTER — Other Ambulatory Visit: Payer: Self-pay

## 2023-01-24 ENCOUNTER — Ambulatory Visit: Payer: Medicaid Other | Admitting: Nurse Practitioner

## 2023-01-24 ENCOUNTER — Encounter: Payer: Self-pay | Admitting: Nurse Practitioner

## 2023-01-24 VITALS — BP 136/88 | HR 100 | Temp 97.8°F | Resp 16 | Ht 73.0 in | Wt >= 6400 oz

## 2023-01-24 DIAGNOSIS — I10 Essential (primary) hypertension: Secondary | ICD-10-CM | POA: Diagnosis not present

## 2023-01-24 NOTE — Assessment & Plan Note (Signed)
Continue hydrochlorothiazide 12.5 mg daily and amlodipine 10 mg daily. Continue to monitor blood pressure.  Goal is to be around 120s/70s.  Try and reduce salt in your diet and get exercise in.

## 2023-02-01 ENCOUNTER — Other Ambulatory Visit: Payer: Self-pay | Admitting: Physician Assistant

## 2023-02-01 DIAGNOSIS — I10 Essential (primary) hypertension: Secondary | ICD-10-CM

## 2023-02-01 NOTE — Telephone Encounter (Signed)
Requested medication (s) are due for refill today: Yes  Requested medication (s) are on the active medication list: Yes  Last refill:  01/10/23  Future visit scheduled: Yes  Notes to clinic:  Pharmacy requests 90 day supply and diagnosis code.    Requested Prescriptions  Pending Prescriptions Disp Refills   hydrochlorothiazide (HYDRODIURIL) 12.5 MG tablet [Pharmacy Med Name: HYDROCHLOROTHIAZIDE 12.5 MG TB] 90 tablet 1    Sig: TAKE 1 TABLET BY MOUTH EVERY DAY     Cardiovascular: Diuretics - Thiazide Passed - 02/01/2023 10:30 AM      Passed - Cr in normal range and within 180 days    Creat  Date Value Ref Range Status  01/01/2023 0.94 0.60 - 1.26 mg/dL Final         Passed - K in normal range and within 180 days    Potassium  Date Value Ref Range Status  01/01/2023 4.3 3.5 - 5.3 mmol/L Final         Passed - Na in normal range and within 180 days    Sodium  Date Value Ref Range Status  01/01/2023 140 135 - 146 mmol/L Final  04/19/2015 140 134 - 144 mmol/L Final         Passed - Last BP in normal range    BP Readings from Last 1 Encounters:  01/24/23 136/88         Passed - Valid encounter within last 6 months    Recent Outpatient Visits           1 week ago Essential hypertension, benign   [facility] Berniece Salines, FNP   3 weeks ago Essential hypertension, benign   [place] [facility] Mecum, Oswaldo Conroy, PA-C   1 month ago Essential hypertension, benign   [facility] Mecum, Oswaldo Conroy, PA-C   2 years ago Pain of left lower extremity   [place] [facility] Jamelle Haring, MD   4 years ago Flu-like symptoms   [place] [facility] Poulose, Percell Belt, NP       Future Appointments             In 5 months Zane Herald, Rudolpho Sevin, FNP [facility]             amLODipine (NORVASC) 10 MG tablet [Pharmacy Med Name:  AMLODIPINE BESYLATE 10 MG TAB] 90 tablet 1    Sig: TAKE 1 TABLET BY MOUTH EVERY DAY     Cardiovascular: Calcium Channel Blockers 2 Passed - 02/01/2023 10:30 AM      Passed - Last BP in normal range    BP Readings from Last 1 Encounters:  01/24/23 136/88         Passed - Last Heart Rate in normal range    Pulse Readings from Last 1 Encounters:  01/24/23 100         Passed - Valid encounter within last 6 months    Recent Outpatient Visits           1 week ago Essential hypertension, benign   [facility] Berniece Salines, FNP   3 weeks ago Essential hypertension, benign   [place] [facility] Mecum, Oswaldo Conroy, PA-C   1 month ago Essential hypertension, benign   [place] [facility] Mecum, Oswaldo Conroy, PA-C   2 years ago Pain of left lower extremity   [place]  [facility] Welford Roche D, MD   4 years ago Flu-like symptoms   [place] [facility] Poulose, Percell Belt, NP       Future Appointments             In 5 months Zane Herald, Rudolpho Sevin, FNP [facility]

## 2023-02-02 ENCOUNTER — Ambulatory Visit: Payer: Self-pay | Admitting: *Deleted

## 2023-02-02 NOTE — Telephone Encounter (Signed)
  Chief Complaint: A lot of nasal congestion for the last 2 days. Symptoms: Slight cough with drainage down back of throat Frequency: above Pertinent Negatives: Patient denies Fever, body aches, chills, sore throat.  Covid test negative. Disposition: [] ED /[] Urgent Care (no appt availability in office) / [] Appointment(In office/virtual)/ []  [place] [facility]/ [x] Home Care/ [] Refused Recommended Disposition /[] [facility]/ []  Follow-up with PCP Additional Notes: Wife, Philippa Chester wants him to go to the urgent care because she wants him on an antibiotic to prevent him getting a sinus infection.

## 2023-02-02 NOTE — Telephone Encounter (Signed)
Message from Allenwood H sent at 02/02/2023 10:55 AM EST  Pt having nasal congestion, head stopped up, requessting abx.  Please advise    Call History  Contact Date/Time Type Contact Phone/Fax User  02/02/2023 10:37 AM EST Phone (Incoming) Barto,Brittany (Emergency Contact) [phone] Rexene Edison) Crist Infante   Reason for Disposition  Common cold with no complications    Wife requesting an antibiotic to prevent him from getting a sinus infection.  Answer Assessment - Initial Assessment Questions 1. ONSET: "When did the nasal discharge start?"      Yesterday he is having nasal congestion.   I returned call and wife, Brayton El answered and said she had called.   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   He is taking the Coricidin.    No fever, body aches or chills.    He gets a sinus infection every year.  I'm trying to get ahead of him getting a sinus infection.  I was wondering if an antibiotic could be called in for him.    He has a lot of congestion in his head.   Has a slight cough that is draining down the back of his throat.   He works 10 hour shifts at night.   It's hard to get in. 2. AMOUNT: "How much discharge is there?"      A lot of nasal congestion 3. COUGH: "Do you have a cough?" If Yes, ask: "Describe the color of your sputum" (clear, white, yellow, green)     A slight cough  4. RESPIRATORY DISTRESS: "Describe your breathing."      No 5. FEVER: "Do you have a fever?" If Yes, ask: "What is your temperature, how was it measured, and when did it start?"     No 6. 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 7. OTHER SYMPTOMS: "Do you have any other symptoms?" (e.g., sore throat, earache, wheezing, vomiting)     Slight cough from post nasal drip. 8. PREGNANCY: "Is there any  chance you are pregnant?" "When was your last menstrual period?"     N/A  Protocols used: Common Cold-A-AH

## 2023-07-24 ENCOUNTER — Encounter: Payer: Self-pay | Admitting: Nurse Practitioner

## 2023-09-13 ENCOUNTER — Other Ambulatory Visit: Payer: Self-pay | Admitting: Nurse Practitioner

## 2023-09-13 DIAGNOSIS — I10 Essential (primary) hypertension: Secondary | ICD-10-CM

## 2023-09-14 NOTE — Telephone Encounter (Signed)
 30 day courtesy refill. Pt needs appt and office visit for further refills. Called pt and LM on VM to call office to make appt.  Requested Prescriptions  Pending Prescriptions Disp Refills   amLODipine  (NORVASC ) 10 MG tablet [Pharmacy Med Name: AMLODIPINE  BESYLATE 10 MG TAB] 30 tablet 0    Sig: TAKE 1 TABLET BY MOUTH EVERY DAY     Cardiovascular: Calcium Channel Blockers 2 Failed - 09/14/2023 10:21 AM      Failed - Valid encounter within last 6 months    Recent Outpatient Visits   None            Passed - Last BP in normal range    BP Readings from Last 1 Encounters:  01/24/23 136/88         Passed - Last Heart Rate in normal range    Pulse Readings from Last 1 Encounters:  01/24/23 100

## 2023-10-10 ENCOUNTER — Ambulatory Visit (INDEPENDENT_AMBULATORY_CARE_PROVIDER_SITE_OTHER): Payer: Self-pay | Admitting: Nurse Practitioner

## 2023-10-10 ENCOUNTER — Ambulatory Visit: Admitting: Nurse Practitioner

## 2023-10-10 ENCOUNTER — Encounter: Payer: Self-pay | Admitting: Nurse Practitioner

## 2023-10-10 VITALS — BP 138/82 | HR 91 | Temp 98.9°F | Ht 72.64 in | Wt >= 6400 oz

## 2023-10-10 DIAGNOSIS — G4733 Obstructive sleep apnea (adult) (pediatric): Secondary | ICD-10-CM | POA: Diagnosis not present

## 2023-10-10 DIAGNOSIS — Z720 Tobacco use: Secondary | ICD-10-CM | POA: Diagnosis not present

## 2023-10-10 DIAGNOSIS — Z9884 Bariatric surgery status: Secondary | ICD-10-CM

## 2023-10-10 DIAGNOSIS — I10 Essential (primary) hypertension: Secondary | ICD-10-CM | POA: Diagnosis not present

## 2023-10-10 DIAGNOSIS — E119 Type 2 diabetes mellitus without complications: Secondary | ICD-10-CM

## 2023-10-10 LAB — URINALYSIS, MICROSCOPIC ONLY

## 2023-10-10 LAB — MICROALBUMIN / CREATININE URINE RATIO
Creatinine,U: 132.3 mg/dL
Microalb Creat Ratio: UNDETERMINED mg/g (ref 0.0–30.0)
Microalb, Ur: <0.7 mg/dL

## 2023-10-10 NOTE — Assessment & Plan Note (Signed)
 S/p gastric surgery. Has gained weight back. Counseled  on healthy lifestyle changes. Will see if GLp1  are an option to aid in weight loss

## 2023-10-10 NOTE — Assessment & Plan Note (Signed)
 Pending CBC, b12 and vitamin D 

## 2023-10-10 NOTE — Assessment & Plan Note (Signed)
 Pending A1C. Has been below diabetic range after weight loss surgery

## 2023-10-10 NOTE — Progress Notes (Signed)
 New Patient Office Visit  Subjective    Patient ID: Walter Parks, male    DOB: [birth year]  Age: 38 y.o. MRN: [id number]  CC:  Chief Complaint  Patient presents with   New Patient (Initial Visit)    He would like to discuss weight loss    EARWAX    HPI Walter Parks presents to establish care   HTN: states that he has been off the medications for approx 1 month. States that he does not check it at home, but can. He had to get it down for employment  DM2:  Gastric surgery: states that he had 2017 and was 598. States that he lost approx 267 pounds. He has gained some of it back during the day. 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 States that the does soda and sugar. States that he is willing to change Prior he was doing 2/3rd shift and was hard States that he does a lot of walking through  the day. He does like to swim but is not doing it currently   Tdap: 2017 Flu: out of season  Covid: original series Pna: too young  Shingles: too young  HPV: refused   Colonoscopy: too young, currently average risk  PSA: too young, currently average risk   Outpatient Encounter Medications as of 10/10/2023  Medication Sig   calcium gluconate 500 MG tablet Take 1 tablet by mouth daily.   Multiple Vitamins-Minerals (MULTIVITAMIN WITH MINERALS) tablet Take 1 tablet by mouth daily.   amLODipine  (NORVASC ) 10 MG tablet TAKE 1 TABLET BY MOUTH EVERY DAY (Patient not taking: Reported on 10/10/2023)   hydrochlorothiazide  (HYDRODIURIL ) 12.5 MG tablet TAKE 1 TABLET BY MOUTH EVERY DAY (Patient not taking: Reported on 10/10/2023)   [DISCONTINUED] fluticasone (FLONASE) 50 MCG/ACT nasal spray Place into both nostrils. (Patient not taking: No sig reported)   No facility-administered encounter medications on file as of 10/10/2023.    Past Medical History:  Diagnosis Date   Depression    Diabetes mellitus without complication (HCC)    GERD (gastroesophageal reflux disease)    Hx of tobacco use,  presenting hazards to health 06/25/2015   Hypertension    Obesity    Sleep apnea    on an APAP machine    Past Surgical History:  Procedure Laterality Date   LAPAROSCOPIC GASTRIC RESTRICTIVE DUODENAL PROCEDURE (DUODENAL SWITCH) N/A 11/02/2015   Procedure: LAPAROSCOPIC GASTRIC RESTRICTIVE DUODENAL PROCEDURE (DUODENAL SWITCH);  Surgeon: Ozell DELENA Mano, MD;  Location: [facility] ORS;  Service: General;  Laterality: N/A;   TONSILLECTOMY     TONSILLECTOMY      Family History  Problem Relation Age of Onset   Hypertension Mother    Hypertension Father    Diabetes Maternal Aunt    Hypertension Maternal Grandmother    Hypertension Maternal Grandfather    Stroke Paternal Grandmother    Hypertension Paternal Grandmother    Hypertension Paternal Grandfather    Diabetes Cousin    Heart disease Neg Hx    COPD Neg Hx     Social History   Socioeconomic History   Marital status: Married    Spouse name: brittany   Number of children: 2   Years of education: Not on file   Highest education level: Some college, no degree  Occupational History   Not on file  Tobacco Use   Smoking status: Every Day    Current packs/day: 0.00    Types: Cigarettes    Last attempt  to quit: 07/31/1997    Years since quitting: 26.2   Smokeless tobacco: Never   Tobacco comments:    3 cigarettes a day   Vaping Use   Vaping status: Never Used  Substance and Sexual Activity   Alcohol use: Yes    Alcohol/week: 0.0 standard drinks of alcohol    Comment: socially   Drug use: No   Sexual activity: Yes    Partners: Female    Birth control/protection: None  Other Topics Concern   Not on file  Social History Narrative   Fulltime: document control specialist    Social Drivers of Health   Financial Resource Strain: Low Risk  (05/31/2018)   Overall Financial Resource Strain (CARDIA)    Difficulty of Paying Living Expenses: Not hard at all  Food Insecurity: No Food Insecurity (05/31/2018)   Hunger Vital Sign     Worried About Running Out of Food in the Last Year: Never true    Ran Out of Food in the Last Year: Never true  Transportation Needs: No Transportation Needs (05/31/2018)   PRAPARE - Administrator, Civil Service (Medical): No    Lack of Transportation (Non-Medical): No  Physical Activity: Insufficiently Active (05/31/2018)   Exercise Vital Sign    Days of Exercise per Week: 2 days    Minutes of Exercise per Session: 60 min  Stress: Not on file (01/25/2023)  Social Connections: Unknown (09/13/2021)   [facility]   Social Network    Social Network: Not on file  Intimate Partner Violence: Unknown (09/13/2021)   Received from [facility]   HITS    Physically Hurt: Not on file    Insult or Talk Down To: Not on file    Threaten Physical Harm: Not on file    Scream or Curse: Not on file    Review of Systems  Constitutional:  Negative for chills and fever.  Respiratory:  Negative for shortness of breath.   Cardiovascular:  Negative for chest pain and leg swelling.  Gastrointestinal:  Negative for abdominal pain, blood in stool, constipation, diarrhea, nausea and vomiting.       BM daily to several times   Genitourinary:  Negative for dysuria and hematuria.  Neurological:  Negative for dizziness, tingling and headaches.  Psychiatric/Behavioral:  Negative for hallucinations and suicidal ideas.         Objective    BP 138/82   Pulse 91   Temp 98.9 F (37.2 C) (Oral)   Ht 6' 0.64 (1.845 m)   Wt (!) 431 lb (195.5 kg)   SpO2 99%   BMI 57.43 kg/m   Physical Exam Vitals and nursing note reviewed.  Constitutional:      Appearance: Normal appearance.  HENT:     Right Ear: Tympanic membrane, ear canal and external ear normal.     Left Ear: Tympanic membrane, ear canal and external ear normal.     Mouth/Throat:     Mouth: Mucous membranes are moist.     Pharynx: Oropharynx is clear.  Eyes:     Extraocular Movements: Extraocular movements intact.      Pupils: Pupils are equal, round, and reactive to light.  Cardiovascular:     Rate and Rhythm: Normal rate and regular rhythm.     Heart sounds: Normal heart sounds.  Pulmonary:     Effort: Pulmonary effort is normal.     Breath sounds: Normal breath sounds.  Abdominal:     General: Bowel sounds  are normal.  Musculoskeletal:     Right lower leg: No edema.     Left lower leg: No edema.  Lymphadenopathy:     Cervical: No cervical adenopathy.  Neurological:     General: No focal deficit present.     Mental Status: He is alert.     Comments: Bilateral upper and lower extremity strength 5/5         Assessment & Plan:   Problem List Items Addressed This Visit       Cardiovascular and Mediastinum   Essential hypertension, benign - Primary (Chronic)   Hx of the same. BP elevated on initial check. Borderline on recheck. He has medications at home recommend continuing medication s for blood pressure as prescribed. Continue amlopdipine 10mg  and hydrochlorothiazide  12.5mg  daily       Relevant Orders   CBC   Comprehensive metabolic panel with GFR   TSH     Respiratory   OSA (obstructive sleep apnea)     Endocrine   Diet-controlled diabetes mellitus (HCC)   Pending A1C. Has been below diabetic range after weight loss surgery       Relevant Orders   CBC   Comprehensive metabolic panel with GFR   Hemoglobin A1c   Lipid panel   Microalbumin / creatinine urine ratio     Other   Morbid obesity (HCC)   S/p gastric surgery. Has gained weight back. Counseled  on healthy lifestyle changes. Will see if GLp1  are an option to aid in weight loss      Relevant Orders   Hemoglobin A1c   TSH   Lipid panel   Tobacco abuse   Pending urine microscopy to rule out microscopic hematuria       Relevant Orders   Urine Microscopic   History of weight loss surgery   Pending CBC, b12 and vitamin D        Relevant Orders   Vitamin B12   VITAMIN D  25 Hydroxy (Vit-D Deficiency,  Fractures)    Return in about 3 months (around 01/10/2024) for CPE and Labs.   Adina Crandall, NP

## 2023-10-10 NOTE — Assessment & Plan Note (Signed)
 Hx of the same. BP elevated on initial check. Borderline on recheck. He has medications at home recommend continuing medication s for blood pressure as prescribed. Continue amlopdipine 10mg  and hydrochlorothiazide  12.5mg  daily

## 2023-10-10 NOTE — Assessment & Plan Note (Signed)
 Pending urine microscopy to rule out microscopic hematuria

## 2023-10-10 NOTE — Patient Instructions (Signed)
 Nice to see you today Check with your insurance about Zepbound and Georjean See me in 3 months for your physical, sooner if you need me

## 2023-10-11 LAB — COMPREHENSIVE METABOLIC PANEL WITH GFR
ALT: 13 U/L (ref 0–53)
AST: 18 U/L (ref 0–37)
Albumin: 4.2 g/dL (ref 3.5–5.2)
Alkaline Phosphatase: 114 U/L (ref 39–117)
BUN: 9 mg/dL (ref 6–23)
CO2: 29 meq/L (ref 19–32)
Calcium: 9.4 mg/dL (ref 8.4–10.5)
Chloride: 100 meq/L (ref 96–112)
Creatinine, Ser: 0.87 mg/dL (ref 0.40–1.50)
GFR: 110.57 mL/min (ref >60.00)
Glucose, Bld: 86 mg/dL (ref 70–99)
Potassium: 4.3 meq/L (ref 3.5–5.1)
Sodium: 138 meq/L (ref 135–145)
Total Bilirubin: 0.5 mg/dL (ref 0.2–1.2)
Total Protein: 7.5 g/dL (ref 6.0–8.3)

## 2023-10-11 LAB — LIPID PANEL
Cholesterol: 185 mg/dL (ref 0–200)
HDL: 45.4 mg/dL (ref >39.00)
LDL Cholesterol: 121 mg/dL — ABNORMAL HIGH (ref 0–99)
NonHDL: 139.35
Total CHOL/HDL Ratio: 4
Triglycerides: 93 mg/dL (ref 0.0–149.0)
VLDL: 18.6 mg/dL (ref 0.0–40.0)

## 2023-10-11 LAB — CBC
HCT: 49.7 % (ref 39.0–52.0)
Hemoglobin: 16 g/dL (ref 13.0–17.0)
MCHC: 32.2 g/dL (ref 30.0–36.0)
MCV: 81.4 fl (ref 78.0–100.0)
Platelets: 331 K/uL (ref 150.0–400.0)
RBC: 6.1 Mil/uL — ABNORMAL HIGH (ref 4.22–5.81)
RDW: 15.6 % — ABNORMAL HIGH (ref 11.5–15.5)
WBC: 9 K/uL (ref 4.0–10.5)

## 2023-10-11 LAB — VITAMIN D 25 HYDROXY (VIT D DEFICIENCY, FRACTURES): VITD: 18.83 ng/mL — ABNORMAL LOW (ref 30.00–100.00)

## 2023-10-11 LAB — TSH: TSH: 1.35 u[IU]/mL (ref 0.35–5.50)

## 2023-10-11 LAB — VITAMIN B12: Vitamin B-12: 285 pg/mL (ref 211–911)

## 2023-10-11 LAB — HEMOGLOBIN A1C: Hgb A1c MFr Bld: 6.4 % (ref 4.6–6.5)

## 2023-10-17 ENCOUNTER — Ambulatory Visit: Payer: Self-pay | Admitting: Nurse Practitioner

## 2023-10-17 DIAGNOSIS — E559 Vitamin D deficiency, unspecified: Secondary | ICD-10-CM

## 2023-10-17 MED ORDER — VITAMIN D (ERGOCALCIFEROL) 1.25 MG (50000 UNIT) PO CAPS: 50000.0000 [IU] | ORAL_CAPSULE | ORAL | 0 refills | Status: DC

## 2023-12-13 ENCOUNTER — Encounter: Payer: Self-pay | Admitting: Nurse Practitioner

## 2023-12-13 DIAGNOSIS — E119 Type 2 diabetes mellitus without complications: Secondary | ICD-10-CM

## 2023-12-20 ENCOUNTER — Other Ambulatory Visit (HOSPITAL_COMMUNITY): Payer: Self-pay

## 2023-12-20 ENCOUNTER — Telehealth: Payer: Self-pay

## 2023-12-20 MED ORDER — OZEMPIC (0.25 OR 0.5 MG/DOSE) 2 MG/3ML ~~LOC~~ SOPN: 0.2500 mg | PEN_INJECTOR | SUBCUTANEOUS | 0 refills | Status: DC

## 2023-12-20 NOTE — Addendum Note (Signed)
 Addended by: WENDEE LYNWOOD HERO on: 12/20/2023 01:46 PM   Modules accepted: Orders

## 2023-12-20 NOTE — Telephone Encounter (Signed)
 Pharmacy Patient Advocate Encounter   Received notification from Onbase that prior authorization for Ozempic (0.25 or 0.5 MG/DOSE) 2MG /3ML pen-injectors  is required/requested.   Insurance verification completed.   The patient is insured through [employer].   Per test claim: PA required; PA started via CoverMyMeds. KEY [id number] . Waiting for clinical questions to populate.

## 2023-12-20 NOTE — Telephone Encounter (Signed)
 Pharmacy Patient Advocate Encounter  Received notification from [facility] that Prior Authorization for Ozempic (0.25 or 0.5 MG/DOSE) 2MG /3ML pen-injectors has been APPROVED from 11/20/23 to 12/19/24. Ran test claim, Copay is $24.99. This test claim was processed through [facility]- copay amounts may vary at other pharmacies due to pharmacy/plan contracts, or as the patient moves through the different stages of their insurance plan.   PA #/Case ID/Reference #: [id number]

## 2023-12-20 NOTE — Telephone Encounter (Signed)
 Clinical questions have been answered and PA submitted. PA currently Pending.

## 2024-01-11 ENCOUNTER — Other Ambulatory Visit: Payer: Self-pay | Admitting: Nurse Practitioner

## 2024-01-11 DIAGNOSIS — E559 Vitamin D deficiency, unspecified: Secondary | ICD-10-CM

## 2024-02-08 ENCOUNTER — Ambulatory Visit (INDEPENDENT_AMBULATORY_CARE_PROVIDER_SITE_OTHER): Admitting: Nurse Practitioner

## 2024-02-08 ENCOUNTER — Encounter: Payer: Self-pay | Admitting: Nurse Practitioner

## 2024-02-08 VITALS — BP 150/82 | HR 78 | Temp 98.4°F | Ht 72.64 in | Wt >= 6400 oz

## 2024-02-08 DIAGNOSIS — R1010 Upper abdominal pain, unspecified: Secondary | ICD-10-CM

## 2024-02-08 DIAGNOSIS — I10 Essential (primary) hypertension: Secondary | ICD-10-CM

## 2024-02-08 DIAGNOSIS — E119 Type 2 diabetes mellitus without complications: Secondary | ICD-10-CM | POA: Diagnosis not present

## 2024-02-08 DIAGNOSIS — Z7985 Long-term (current) use of injectable non-insulin antidiabetic drugs: Secondary | ICD-10-CM | POA: Diagnosis not present

## 2024-02-08 DIAGNOSIS — Z6841 Body Mass Index (BMI) 40.0 and over, adult: Secondary | ICD-10-CM

## 2024-02-08 DIAGNOSIS — Z2821 Immunization not carried out because of patient refusal: Secondary | ICD-10-CM

## 2024-02-08 LAB — LIPASE: Lipase: 20 U/L (ref 11.0–59.0)

## 2024-02-08 MED ORDER — OZEMPIC (0.25 OR 0.5 MG/DOSE) 2 MG/3ML ~~LOC~~ SOPN: 0.5000 mg | PEN_INJECTOR | SUBCUTANEOUS | 0 refills | Status: DC

## 2024-02-08 MED ORDER — HYDROCHLOROTHIAZIDE 25 MG PO TABS: 25.0000 mg | ORAL_TABLET | Freq: Every day | ORAL | 0 refills | Status: AC

## 2024-02-08 NOTE — Patient Instructions (Addendum)
 Nice to see you today I am increasing the hydrochlorothiazide  from 12.5mg  to 25mg . You can taek 2 tablets of the 12.5mg  at the same time until you finish the supply you have at home I have also increased the ozempic  to 0.5mg  weekly Follow up with me 1 month, sooner if you need me

## 2024-02-08 NOTE — Progress Notes (Signed)
 Established Patient Office Visit  Subjective   Patient ID: Walter Parks, male    DOB: [birth year]  Age: 38 y.o. MRN: [id number]  Chief Complaint  Patient presents with   Medication Management    Pt would like to discuss BP medication.states he feels its not strong enough. Recent readings 150/90     HPI  Discussed the use of AI scribe software for clinical note transcription with the patient, who gave verbal consent to proceed.  History of Present Illness Walter Parks is a 38 year old male with hypertension who presents with concerns about blood pressure management.  He has a history of hypertension and is currently taking amlodipine  10 mg and hydrochlorothiazide  12.5 mg. He had previously stopped these medications but resumed them approximately two weeks after his last visit in July. He monitors his blood pressure at home, with readings consistently around 140-150/90-96 mmHg, which remain elevated.  Recently, he experienced abdominal discomfort, initially fearing a heart attack. The pain was described as a pressure starting in the abdomen and radiating to the back, causing significant concern. No chest pain or shortness of breath was noted during this episode.  He is also on Ozempic  and has completed five doses. He reports a significant decrease in appetite, eating only once a day, and plans to incorporate protein shakes in the morning to manage feelings of faintness by lunchtime. He notes a change in bowel habits, with a reduction from three to five times a day to about twice daily, and occasional difficulty with bowel movements, which he attributes to trapped gas.  He has stopped drinking sodas entirely and is focusing on staying hydrated, especially with the use of hydrochlorothiazide . No nausea, vomiting, or dizziness related to his blood pressure or medication use.     Review of Systems  Constitutional:  Negative for chills and fever.  Respiratory:  Negative for shortness of  breath.   Cardiovascular:  Negative for chest pain.  Gastrointestinal:  Positive for abdominal pain and nausea. Negative for constipation and vomiting.  Neurological:  Negative for dizziness and headaches.      Objective:     BP (!) 150/82   Pulse 78   Temp 98.4 F (36.9 C) (Oral)   Ht 6' 0.64 (1.845 m)   Wt (!) 439 lb 6.4 oz (199.3 kg)   SpO2 98%   BMI 58.55 kg/m  BP Readings from Last 3 Encounters:  02/08/24 (!) 150/82  10/10/23 138/82  01/24/23 136/88   Wt Readings from Last 3 Encounters:  02/08/24 (!) 439 lb 6.4 oz (199.3 kg)  10/10/23 (!) 431 lb (195.5 kg)  01/24/23 (!) 410 lb 11.2 oz (186.3 kg)   SpO2 Readings from Last 3 Encounters:  02/08/24 98%  10/10/23 99%  01/24/23 98%      Physical Exam Vitals and nursing note reviewed.  Constitutional:      Appearance: Normal appearance.  Cardiovascular:     Rate and Rhythm: Normal rate and regular rhythm.     Heart sounds: Normal heart sounds.  Pulmonary:     Effort: Pulmonary effort is normal.     Breath sounds: Normal breath sounds.  Abdominal:     General: Bowel sounds are normal. There is no distension.     Palpations: There is no mass.     Tenderness: There is no abdominal tenderness.     Hernia: No hernia is present.  Neurological:     Mental Status: He is alert.  No results found for any visits on 02/08/24.    The ASCVD Risk score (Arnett DK, et al., 2019) failed to calculate for the following reasons:   The 2019 ASCVD risk score is only valid for ages 68 to 25    Assessment & Plan:   Problem List Items Addressed This Visit       Cardiovascular and Mediastinum   Essential hypertension, benign - Primary (Chronic)   Relevant Medications   hydrochlorothiazide  (HYDRODIURIL ) 25 MG tablet     Endocrine   Diet-controlled diabetes mellitus (HCC)   Relevant Medications   Semaglutide ,0.25 or 0.5MG /DOS, (OZEMPIC , 0.25 OR 0.5 MG/DOSE,) 2 MG/3ML SOPN     Other   Morbid obesity (HCC)    Relevant Medications   Semaglutide ,0.25 or 0.5MG /DOS, (OZEMPIC , 0.25 OR 0.5 MG/DOSE,) 2 MG/3ML SOPN   Other Visit Diagnoses       Pain of upper abdomen       Relevant Orders   Lipase     Flu vaccine refused          Assessment and Plan Assessment & Plan Essential hypertension Blood pressure remains elevated despite current medication regimen. Recent chest discomfort likely non-cardiac. No dizziness or syncope. - Increased hydrochlorothiazide  to 25 mg daily. If 12.5 mg tablets are available, take two tablets daily. - Continue amlodipine  10 mg daily. - Monitor blood pressure at least three times a week. - Ensure adequate hydration to prevent dehydration from hydrochlorothiazide . - Scheduled follow-up in one month to assess blood pressure control and kidney function.  Type 2 diabetes mellitus, diet-controlled On Ozempic  with decreased appetite and reduced meal frequency. No significant bowel changes, some constipation. Discussed Ozempic  side effects and checked pancreatic enzymes. - Increased Ozempic  dose to 0.5 mg weekly. - Monitor for changes in bowel movements and manage constipation as needed. - Encouraged small, frequent meals to prevent hypoglycemia. - Checked pancreatic enzymes to rule out pancreatitis.  Morbid obesity Weight management ongoing with Ozempic , aiding in appetite reduction and potential weight loss. - Continue Ozempic  as per diabetes management plan. - Encouraged increased protein intake and small, frequent meals.  Return in about 4 weeks (around 03/07/2024) for BP recheck/BMP.    Adina Crandall, NP

## 2024-02-15 ENCOUNTER — Ambulatory Visit: Payer: Self-pay | Admitting: Nurse Practitioner

## 2024-03-14 ENCOUNTER — Ambulatory Visit: Admitting: Nurse Practitioner

## 2024-03-14 VITALS — BP 138/88 | HR 100 | Temp 97.7°F | Ht 72.64 in | Wt >= 6400 oz

## 2024-03-14 DIAGNOSIS — I10 Essential (primary) hypertension: Secondary | ICD-10-CM

## 2024-03-14 DIAGNOSIS — E119 Type 2 diabetes mellitus without complications: Secondary | ICD-10-CM

## 2024-03-14 DIAGNOSIS — Z7985 Long-term (current) use of injectable non-insulin antidiabetic drugs: Secondary | ICD-10-CM | POA: Diagnosis not present

## 2024-03-14 MED ORDER — OZEMPIC (0.25 OR 0.5 MG/DOSE) 2 MG/3ML ~~LOC~~ SOPN: 0.5000 mg | PEN_INJECTOR | SUBCUTANEOUS | 2 refills | Status: AC

## 2024-03-14 NOTE — Patient Instructions (Addendum)
 Nice to see you today I want you to check your blood pressure at home twice a week. I want the top number to be under 140 consistenlty and the bottom number to be under 90 consistently   Follow up with me in 2 months

## 2024-03-14 NOTE — Progress Notes (Signed)
 "  Established Patient Office Visit  Subjective   Patient ID: Walter Parks, male    DOB: 08-11-1985  Age: 38 y.o. MRN: [id number]  Chief Complaint  Patient presents with   Follow-up    HPI  Discussed the use of AI scribe software for clinical note transcription with the patient, who gave verbal consent to proceed.  History of Present Illness Walter Parks is a 38 year old male with hypertension who presents for follow-up of blood pressure management.  He was previously prescribed hydrochlorothiazide , which was increased from 12.5 mg to 25 mg, and amlodipine  10mg  daily. He tolerates the medication well, although it increases urination, particularly in the morning. He has not been consistently checking his blood pressure at home but has the capability to do so. He sometimes gets confused with his medication bottles and has been taking 12.5 mg of hydrochlorothiazide  instead of the prescribed 25 mg. He confirms taking amlodipine  as prescribed.  He is also on semaglutide  (Ozempic ) at a dose of 0.5 mg. He has decreased portion sizes but no change in meal frequency. He has been drinking low-sugar flavored water but admits to consuming more sugary drinks during the holiday season. Despite this, he has lost three pounds over the past month.  He experiences gas issues since the last visit, for which he takes Gas-X (simethicone) about once a week, down from two to three times a week previously. No recent abdominal cramping and normal bowel movements.     Review of Systems  Constitutional:  Negative for chills and fever.  Respiratory:  Negative for shortness of breath.   Cardiovascular:  Negative for chest pain and leg swelling.  Gastrointestinal:  Negative for constipation.  Neurological:  Negative for dizziness and headaches.      Objective:     BP 138/88   Pulse 100   Temp 97.7 F (36.5 C) (Oral)   Ht 6' 0.64 (1.845 m)   Wt (!) 436 lb 12.8 oz (198.1 kg)   SpO2 96%   BMI 58.20 kg/m   BP Readings from Last 3 Encounters:  03/14/24 138/88  02/08/24 (!) 150/82  10/10/23 138/82   Wt Readings from Last 3 Encounters:  03/14/24 (!) 436 lb 12.8 oz (198.1 kg)  02/08/24 (!) 439 lb 6.4 oz (199.3 kg)  10/10/23 (!) 431 lb (195.5 kg)   SpO2 Readings from Last 3 Encounters:  03/14/24 96%  02/08/24 98%  10/10/23 99%      Physical Exam Vitals and nursing note reviewed.  Constitutional:      Appearance: Normal appearance.  Cardiovascular:     Rate and Rhythm: Normal rate and regular rhythm.     Heart sounds: Normal heart sounds.  Pulmonary:     Effort: Pulmonary effort is normal.     Breath sounds: Normal breath sounds.  Abdominal:     General: Bowel sounds are normal.  Neurological:     Mental Status: He is alert.      No results found for any visits on 03/14/24.    The ASCVD Risk score (Arnett DK, et al., 2019) failed to calculate for the following reasons:   The 2019 ASCVD risk score is only valid for ages 23 to 27    Assessment & Plan:   Problem List Items Addressed This Visit       Cardiovascular and Mediastinum   Essential hypertension, benign - Primary (Chronic)   Relevant Orders   Basic metabolic panel with GFR     Endocrine  Diet-controlled diabetes mellitus (HCC)   Relevant Medications   Semaglutide ,0.25 or 0.5MG /DOS, (OZEMPIC , 0.25 OR 0.5 MG/DOSE,) 2 MG/3ML SOPN   Assessment and Plan Assessment & Plan Essential hypertension Blood pressure remains elevated. Hydrochlorothiazide  dose was increased but not taken as prescribed. Amlodipine  is well tolerated. Target is systolic <140 mmHg and diastolic <90 mmHg. - Ensure hydrochlorothiazide  25 mg is taken daily. Continue amlodipine  10mg  daily  - Check blood pressure at home 1-2 times a week. - Recheck blood pressure in office. - Order blood test to monitor kidney function and potassium levels.  Type 2 diabetes mellitus Managed with semaglutide  0.5 mg. Reports decreased appetite and weight  loss of 3 pounds. No significant gastrointestinal side effects. - Continue semaglutide  0.5 mg. - Encourage dietary modifications, focusing on water intake and reducing sugary drinks.  Morbid obesity Weight decreased by 3 pounds. Dietary habits affected by holidays, but improvement efforts continue. Semaglutide  contributes to decreased appetite. - Continue improving dietary habits, focusing on water intake and reducing sugary drinks. - Monitor weight and dietary habits.  Return in about 2 months (around 05/15/2024) for DM recheck, BP recheck.    Adina Crandall, NP  "

## 2024-03-15 LAB — BASIC METABOLIC PANEL WITH GFR
BUN: 12 mg/dL (ref 7–25)
CO2: 28 mmol/L (ref 20–32)
Calcium: 9.4 mg/dL (ref 8.6–10.3)
Chloride: 102 mmol/L (ref 98–110)
Creat: 0.99 mg/dL (ref 0.60–1.26)
Glucose, Bld: 83 mg/dL (ref 65–99)
Potassium: 4.1 mmol/L (ref 3.5–5.3)
Sodium: 137 mmol/L (ref 135–146)
eGFR: 101 mL/min/1.73m2

## 2024-03-17 ENCOUNTER — Ambulatory Visit: Payer: Self-pay | Admitting: Nurse Practitioner

## 2024-05-14 ENCOUNTER — Ambulatory Visit: Admitting: Nurse Practitioner
